# Patient Record
Sex: Female | Born: 1985 | Race: Black or African American | Hispanic: No | Marital: Single | Smoking: Never smoker
Health system: Southern US, Community
[De-identification: ages and names within clinical notes are randomized; demographics above are authoritative.]

## PROBLEM LIST (undated history)

## (undated) ENCOUNTER — Inpatient Hospital Stay (HOSPITAL_COMMUNITY): Payer: Self-pay

## (undated) DIAGNOSIS — F419 Anxiety disorder, unspecified: Secondary | ICD-10-CM

## (undated) DIAGNOSIS — E559 Vitamin D deficiency, unspecified: Secondary | ICD-10-CM

## (undated) DIAGNOSIS — F32A Depression, unspecified: Secondary | ICD-10-CM

## (undated) DIAGNOSIS — Z789 Other specified health status: Secondary | ICD-10-CM

## (undated) DIAGNOSIS — F329 Major depressive disorder, single episode, unspecified: Secondary | ICD-10-CM

## (undated) HISTORY — PX: ABDOMINAL HERNIA REPAIR: SHX539

## (undated) HISTORY — DX: Major depressive disorder, single episode, unspecified: F32.9

## (undated) HISTORY — DX: Depression, unspecified: F32.A

## (undated) HISTORY — DX: Vitamin D deficiency, unspecified: E55.9

## (undated) HISTORY — DX: Other specified health status: Z78.9

## (undated) HISTORY — DX: Anxiety disorder, unspecified: F41.9

---
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
## 2006-08-23 ENCOUNTER — Other Ambulatory Visit: Admission: RE | Admit: 2006-08-23 | Discharge: 2006-08-23 | Payer: Self-pay | Admitting: Emergency Medicine

## 2007-04-27 ENCOUNTER — Other Ambulatory Visit: Admission: RE | Admit: 2007-04-27 | Discharge: 2007-04-27 | Payer: Self-pay | Admitting: Internal Medicine

## 2007-12-11 ENCOUNTER — Other Ambulatory Visit: Admission: RE | Admit: 2007-12-11 | Discharge: 2007-12-11 | Payer: Self-pay | Admitting: Obstetrics and Gynecology

## 2009-12-03 ENCOUNTER — Other Ambulatory Visit: Admission: RE | Admit: 2009-12-03 | Discharge: 2009-12-03 | Payer: Self-pay | Admitting: Obstetrics & Gynecology

## 2009-12-31 ENCOUNTER — Other Ambulatory Visit: Admission: RE | Admit: 2009-12-31 | Discharge: 2009-12-31 | Payer: Self-pay | Admitting: Obstetrics & Gynecology

## 2011-06-07 ENCOUNTER — Other Ambulatory Visit: Payer: Self-pay | Admitting: Nurse Practitioner

## 2011-06-07 ENCOUNTER — Other Ambulatory Visit (HOSPITAL_COMMUNITY)
Admission: RE | Admit: 2011-06-07 | Discharge: 2011-06-07 | Disposition: A | Discharge status: Home / Self Care | Payer: Self-pay | Source: Ambulatory Visit | Attending: Unknown Physician Specialty | Admitting: Unknown Physician Specialty

## 2011-06-07 ENCOUNTER — Other Ambulatory Visit (HOSPITAL_COMMUNITY)
Admission: RE | Admit: 2011-06-07 | Discharge: 2011-06-07 | Disposition: A | Discharge status: Home / Self Care | Payer: Self-pay | Source: Ambulatory Visit | Attending: Family Medicine | Admitting: Family Medicine

## 2011-06-07 DIAGNOSIS — B079 Viral wart, unspecified: Secondary | ICD-10-CM | POA: Insufficient documentation

## 2011-06-07 DIAGNOSIS — R87612 Low grade squamous intraepithelial lesion on cytologic smear of cervix (LGSIL): Secondary | ICD-10-CM | POA: Insufficient documentation

## 2011-06-07 DIAGNOSIS — N870 Mild cervical dysplasia: Secondary | ICD-10-CM | POA: Insufficient documentation

## 2012-01-31 ENCOUNTER — Other Ambulatory Visit (HOSPITAL_COMMUNITY)
Admission: RE | Admit: 2012-01-31 | Discharge: 2012-01-31 | Disposition: A | Discharge status: Home / Self Care | Payer: Self-pay | Source: Ambulatory Visit | Attending: Family Medicine | Admitting: Family Medicine

## 2012-01-31 ENCOUNTER — Other Ambulatory Visit (HOSPITAL_COMMUNITY)
Admission: RE | Admit: 2012-01-31 | Discharge: 2012-01-31 | Disposition: A | Discharge status: Home / Self Care | Payer: Self-pay | Source: Ambulatory Visit | Attending: Unknown Physician Specialty | Admitting: Unknown Physician Specialty

## 2012-01-31 ENCOUNTER — Other Ambulatory Visit: Payer: Self-pay | Admitting: Nurse Practitioner

## 2012-01-31 DIAGNOSIS — R87610 Atypical squamous cells of undetermined significance on cytologic smear of cervix (ASC-US): Secondary | ICD-10-CM | POA: Insufficient documentation

## 2012-01-31 DIAGNOSIS — R87619 Unspecified abnormal cytological findings in specimens from cervix uteri: Secondary | ICD-10-CM | POA: Insufficient documentation

## 2013-01-02 ENCOUNTER — Other Ambulatory Visit: Payer: Self-pay | Admitting: Adult Health

## 2013-01-02 ENCOUNTER — Other Ambulatory Visit (HOSPITAL_COMMUNITY)
Admission: RE | Admit: 2013-01-02 | Discharge: 2013-01-02 | Disposition: A | Discharge status: Home / Self Care | Payer: BC Managed Care – PPO | Source: Ambulatory Visit | Attending: Obstetrics and Gynecology | Admitting: Obstetrics and Gynecology

## 2013-01-02 DIAGNOSIS — Z01419 Encounter for gynecological examination (general) (routine) without abnormal findings: Secondary | ICD-10-CM | POA: Insufficient documentation

## 2013-01-02 DIAGNOSIS — Z113 Encounter for screening for infections with a predominantly sexual mode of transmission: Secondary | ICD-10-CM | POA: Insufficient documentation

## 2013-07-01 ENCOUNTER — Telehealth: Payer: Self-pay | Admitting: *Deleted

## 2013-07-01 NOTE — Telephone Encounter (Signed)
Pt stopped the nuva ring and thinks she would like the patch, cause she won't remember to take the pill. But she weighs about 210 pounds, discussed the nexplanon and IUD with her and she is going to come by to get handouts to read about the different types.

## 2013-07-26 ENCOUNTER — Ambulatory Visit: Payer: Self-pay | Admitting: Adult Health

## 2013-12-10 ENCOUNTER — Encounter (INDEPENDENT_AMBULATORY_CARE_PROVIDER_SITE_OTHER): Payer: Self-pay

## 2013-12-10 ENCOUNTER — Encounter: Payer: Self-pay | Admitting: Adult Health

## 2013-12-10 ENCOUNTER — Ambulatory Visit (INDEPENDENT_AMBULATORY_CARE_PROVIDER_SITE_OTHER): Payer: BC Managed Care – PPO | Admitting: Adult Health

## 2013-12-10 VITALS — BP 102/80 | Ht 63.0 in | Wt 215.0 lb

## 2013-12-10 DIAGNOSIS — Z3201 Encounter for pregnancy test, result positive: Secondary | ICD-10-CM

## 2013-12-10 NOTE — Progress Notes (Signed)
Patient ID: Claire Weber, female   DOB: 09-Aug-1986, 27 y.o.   MRN: [id number] Pt here today for pregnancy test, resulted positive. Pt given prenatal vitamins to take 1 tablet daily and appt for initial new ob in 1- 2 weeks.

## 2013-12-11 ENCOUNTER — Other Ambulatory Visit: Payer: Self-pay | Admitting: Obstetrics & Gynecology

## 2013-12-11 DIAGNOSIS — O3680X0 Pregnancy with inconclusive fetal viability, not applicable or unspecified: Secondary | ICD-10-CM

## 2013-12-13 ENCOUNTER — Ambulatory Visit (INDEPENDENT_AMBULATORY_CARE_PROVIDER_SITE_OTHER): Payer: BC Managed Care – PPO

## 2013-12-13 DIAGNOSIS — O3680X0 Pregnancy with inconclusive fetal viability, not applicable or unspecified: Secondary | ICD-10-CM

## 2013-12-13 NOTE — Progress Notes (Signed)
U/S(8+3wks)-single IUP with +FCA noted, FHR-175 bpm, cx long and closed, bilateral adnexa wnl, CRL c/w LMP dates

## 2013-12-25 ENCOUNTER — Encounter: Payer: Self-pay | Admitting: Advanced Practice Midwife

## 2013-12-25 ENCOUNTER — Encounter: Payer: BC Managed Care – PPO | Admitting: Advanced Practice Midwife

## 2013-12-25 ENCOUNTER — Ambulatory Visit (INDEPENDENT_AMBULATORY_CARE_PROVIDER_SITE_OTHER): Payer: BC Managed Care – PPO | Admitting: Advanced Practice Midwife

## 2013-12-25 VITALS — BP 118/76 | Wt 215.0 lb

## 2013-12-25 DIAGNOSIS — Z331 Pregnant state, incidental: Secondary | ICD-10-CM

## 2013-12-25 DIAGNOSIS — Z3490 Encounter for supervision of normal pregnancy, unspecified, unspecified trimester: Secondary | ICD-10-CM | POA: Insufficient documentation

## 2013-12-25 DIAGNOSIS — Z3400 Encounter for supervision of normal first pregnancy, unspecified trimester: Secondary | ICD-10-CM

## 2013-12-25 DIAGNOSIS — Z1389 Encounter for screening for other disorder: Secondary | ICD-10-CM

## 2013-12-25 LAB — POCT URINALYSIS DIPSTICK
Blood, UA: NEGATIVE
Protein, UA: NEGATIVE

## 2013-12-25 MED ORDER — METRONIDAZOLE 0.75 % VA GEL: 1.0000 | Freq: Two times a day (BID) | VAGINAL | Status: DC

## 2013-12-25 MED ORDER — METRONIDAZOLE 0.75 % VA GEL: 1.0000 | Freq: Every day | VAGINAL | Status: DC

## 2013-12-25 NOTE — Progress Notes (Signed)
  Subjective:    Claire Weber is a G1P0 [redacted]w[redacted]d being seen today for her first obstetrical visit.  Her obstetrical history is significant for nothing.  She is undecided about whether she is going to continue with the pregnancy.  She is in a fairly stable relationship, b ut concerned about finances.  Informed that sooner is safer than later, and encouraged to make a decision by 12 weeks.  .  Pregnancy history fully reviewed.  Patient reports no complaints.  Filed Vitals:   12/25/13 1050  BP: 118/76  Weight: 215 lb (97.523 kg)    HISTORY: OB History  Gravida Para Term Preterm AB SAB TAB Ectopic Multiple Living  1             # Outcome Date GA Lbr Len/2nd Weight Sex Delivery Anes PTL Lv  1 CUR              Past Medical History  Diagnosis Date  . Medical history non-contributory    Past Surgical History  Procedure Laterality Date  . Abdominal hernia repair      at age 27   Family History  Problem Relation Age of Onset  . Hypertension Mother   . Hyperlipidemia Mother   . Hypertension Father   . Diabetes Father      Exam       Pelvic Exam:    Perineum: Normal Perineum   Vulva: normal   Vagina:  normal mucosa, thin white discharge with an amine odor, no palpable nodules   Uterus    gravid     Cervix: normal   Adnexa: Not palpable   Urinary:  urethral meatus normal    System:     Skin: normal coloration and turgor, no rashes    Neurologic: oriented, normal, normal mood   Extremities: normal strength, tone, and muscle mass   HEENT PERRLA   Mouth/Teeth mucous membranes moist, pharynx normal without lesions   Neck supple and no masses   Cardiovascular: regular rate and rhythm   Respiratory:  appears well, vitals normal, no respiratory distress, acyanotic, normal RR   Abdomen: soft, non-tender; bowel sounds normal; no masses,  no organomegaly          Assessment:    Pregnancy: G1P0 Patient Active Problem List   Diagnosis Date Noted  . Pregnant 12/25/2013   BV       Plan:     Initial labs deferred until decision to continue pregnancy is made Metrogel qhs X5 Problem list reviewed and updated. Genetic Screening discussed Integrated Screen: requested.If she continues pregnancy  Ultrasound discussed; fetal survey: requested.  Follow up in 2 weeks.  Weber,Claire Beirne 12/25/2013

## 2014-01-27 ENCOUNTER — Encounter (INDEPENDENT_AMBULATORY_CARE_PROVIDER_SITE_OTHER): Payer: Self-pay

## 2014-01-27 ENCOUNTER — Encounter: Payer: PRIVATE HEALTH INSURANCE | Admitting: Obstetrics & Gynecology

## 2014-01-29 ENCOUNTER — Encounter: Payer: Self-pay | Admitting: Obstetrics & Gynecology

## 2014-01-29 NOTE — Progress Notes (Signed)
This encounter was created in error - please disregard.

## 2014-07-09 ENCOUNTER — Encounter: Payer: Self-pay | Admitting: Adult Health

## 2014-07-09 ENCOUNTER — Ambulatory Visit (INDEPENDENT_AMBULATORY_CARE_PROVIDER_SITE_OTHER): Payer: PRIVATE HEALTH INSURANCE | Admitting: Adult Health

## 2014-07-09 DIAGNOSIS — Z3201 Encounter for pregnancy test, result positive: Secondary | ICD-10-CM

## 2014-07-09 LAB — POCT URINE PREGNANCY: Preg Test, Ur: POSITIVE

## 2014-07-09 NOTE — Progress Notes (Signed)
Pt here for pregnancy test. Positive result. Pt has had some cramping, no spotting. Advised to increase fluids, and take it easy when she notices cramping. Advised can be normal in early pregnancy, that's just everything getting settled into place. Pt voiced understanding. JSY

## 2014-07-12 ENCOUNTER — Inpatient Hospital Stay (HOSPITAL_COMMUNITY)
Admission: AD | Admit: 2014-07-12 | Discharge: 2014-07-12 | Disposition: A | Discharge status: Home / Self Care | Payer: Managed Care, Other (non HMO) | Source: Ambulatory Visit | Attending: Obstetrics & Gynecology | Admitting: Obstetrics & Gynecology

## 2014-07-12 ENCOUNTER — Inpatient Hospital Stay (HOSPITAL_COMMUNITY): Payer: Managed Care, Other (non HMO)

## 2014-07-12 ENCOUNTER — Encounter (HOSPITAL_COMMUNITY): Payer: Self-pay

## 2014-07-12 DIAGNOSIS — R109 Unspecified abdominal pain: Secondary | ICD-10-CM

## 2014-07-12 DIAGNOSIS — O209 Hemorrhage in early pregnancy, unspecified: Secondary | ICD-10-CM | POA: Diagnosis present

## 2014-07-12 DIAGNOSIS — A499 Bacterial infection, unspecified: Secondary | ICD-10-CM | POA: Diagnosis not present

## 2014-07-12 DIAGNOSIS — N760 Acute vaginitis: Secondary | ICD-10-CM | POA: Diagnosis not present

## 2014-07-12 DIAGNOSIS — O2390 Unspecified genitourinary tract infection in pregnancy, unspecified trimester: Secondary | ICD-10-CM | POA: Diagnosis not present

## 2014-07-12 DIAGNOSIS — B9689 Other specified bacterial agents as the cause of diseases classified elsewhere: Secondary | ICD-10-CM | POA: Insufficient documentation

## 2014-07-12 DIAGNOSIS — O26899 Other specified pregnancy related conditions, unspecified trimester: Secondary | ICD-10-CM

## 2014-07-12 LAB — URINALYSIS, ROUTINE W REFLEX MICROSCOPIC
Bilirubin Urine: NEGATIVE
GLUCOSE, UA: NEGATIVE mg/dL
Ketones, ur: NEGATIVE mg/dL
LEUKOCYTES UA: NEGATIVE
Nitrite: NEGATIVE
PH: 7.5 (ref 5.0–8.0)
Protein, ur: NEGATIVE mg/dL
SPECIFIC GRAVITY, URINE: 1.015 (ref 1.005–1.030)
Urobilinogen, UA: 0.2 mg/dL (ref 0.0–1.0)

## 2014-07-12 LAB — CBC
HEMATOCRIT: 40.1 % (ref 36.0–46.0)
Hemoglobin: 14 g/dL (ref 12.0–15.0)
MCH: 32.1 pg (ref 26.0–34.0)
MCHC: 34.9 g/dL (ref 30.0–36.0)
MCV: 92 fL (ref 78.0–100.0)
Platelets: 219 10*3/uL (ref 150–400)
RBC: 4.36 MIL/uL (ref 3.87–5.11)
RDW: 13.3 % (ref 11.5–15.5)
WBC: 6.7 10*3/uL (ref 4.0–10.5)

## 2014-07-12 LAB — URINE MICROSCOPIC-ADD ON

## 2014-07-12 LAB — WET PREP, GENITAL
TRICH WET PREP: NONE SEEN
Yeast Wet Prep HPF POC: NONE SEEN

## 2014-07-12 LAB — ABO/RH: ABO/RH(D): O POS

## 2014-07-12 LAB — HCG, QUANTITATIVE, PREGNANCY: hCG, Beta Chain, Quant, S: 9369 m[IU]/mL — ABNORMAL HIGH (ref <5)

## 2014-07-12 MED ORDER — ACETAMINOPHEN 325 MG PO TABS: 650.0000 mg | ORAL_TABLET | Freq: Once | ORAL | Status: DC

## 2014-07-12 MED ORDER — METRONIDAZOLE 0.75 % VA GEL: 1.0000 | Freq: Two times a day (BID) | VAGINAL | Status: DC

## 2014-07-12 MED ORDER — PROMETHAZINE HCL 25 MG PO TABS: 25.0000 mg | ORAL_TABLET | Freq: Four times a day (QID) | ORAL | Status: DC | PRN

## 2014-07-12 NOTE — MAU Note (Signed)
Pt presents complaining of vaginal bleeding that started this am when she wipes. Complains of thick white vaginal discharge.

## 2014-07-12 NOTE — MAU Provider Note (Signed)
History     CSN: [id number]  Arrival date and time: 07/12/14 1001   First Provider Initiated Contact with Patient 07/12/14 1037      Chief Complaint  Patient presents with  . Vaginal Bleeding   HPI Comments: Claire Weber 28 y.o. G2P0010 [id number] present to MAU with cramping x 1 week and vaginal spotting x 1 day. Her pain is 7/10 and she has not taken any medications.  Last intercourse was 1 week ago. She had a TAB in Dec 2014. [facility]  Vaginal Bleeding Associated symptoms include abdominal pain.      Past Medical History  Diagnosis Date  . Medical history non-contributory     Past Surgical History  Procedure Laterality Date  . Abdominal hernia repair      at age 89    Family History  Problem Relation Age of Onset  . Hypertension Mother   . Hyperlipidemia Mother   . Hypertension Father   . Diabetes Father     History  Substance Use Topics  . Smoking status: Never Smoker   . Smokeless tobacco: Never Used  . Alcohol Use: No     Comment: wine on occ-not now    Allergies: No Known Allergies  Prescriptions prior to admission  Medication Sig Dispense Refill  . acetaminophen (TYLENOL) 500 MG tablet Take 500 mg by mouth every 4 (four) hours as needed for mild pain or moderate pain.      Marland Kitchen. ibuprofen (ADVIL,MOTRIN) 200 MG tablet Take 400 mg by mouth every 4 (four) hours as needed for mild pain or moderate pain.      . Pediatric Multiple Vit-C-FA (FLINSTONES GUMMIES OMEGA-3 DHA PO) Take 2 tablets by mouth daily.         Review of Systems  Constitutional: Negative.   HENT: Negative.   Eyes: Negative.   Respiratory: Negative.   Cardiovascular: Negative.   Gastrointestinal: Positive for abdominal pain.  Genitourinary: Positive for vaginal bleeding.       Vaginal spotting  Musculoskeletal: Negative.   Skin: Negative.   Neurological: Negative.   Psychiatric/Behavioral: Negative.    Physical Exam   Blood pressure 118/63, pulse 88,  temperature 98 F (36.7 C), temperature source Oral, resp. rate 18, last menstrual period 05/27/2014, unknown if currently breastfeeding.  Physical Exam  Constitutional: She is oriented to person, place, and time. She appears well-developed and well-nourished. No distress.  HENT:  Head: Normocephalic and atraumatic.  Eyes: Pupils are equal, round, and reactive to light.  GI: Soft. She exhibits no distension. There is no tenderness. There is no rebound and no guarding.  Genitourinary:  Genital:External negative Vaginal:small amount pick discharge Cervix:closed/ thick Bimanual:pressure in LLQ only otherwise negative   Musculoskeletal: Normal range of motion.  Neurological: She is alert and oriented to person, place, and time.  Skin: Skin is warm and dry.  Psychiatric: She has a normal mood and affect. Her behavior is normal. Judgment and thought content normal.   Results for orders placed during the hospital encounter of 07/12/14 (from the past 24 hour(s))  URINALYSIS, ROUTINE W REFLEX MICROSCOPIC     Status: Abnormal   Collection Time    07/12/14 10:25 AM      Result Value Ref Range   Color, Urine YELLOW  YELLOW   APPearance CLEAR  CLEAR   Specific Gravity, Urine 1.015  1.005 - 1.030   pH 7.5  5.0 - 8.0   Glucose, UA NEGATIVE  NEGATIVE mg/dL  Hgb urine dipstick TRACE (*) NEGATIVE   Bilirubin Urine NEGATIVE  NEGATIVE   Ketones, ur NEGATIVE  NEGATIVE mg/dL   Protein, ur NEGATIVE  NEGATIVE mg/dL   Urobilinogen, UA 0.2  0.0 - 1.0 mg/dL   Nitrite NEGATIVE  NEGATIVE   Leukocytes, UA NEGATIVE  NEGATIVE  URINE MICROSCOPIC-ADD ON     Status: Abnormal   Collection Time    07/12/14 10:25 AM      Result Value Ref Range   Squamous Epithelial / LPF FEW (*) RARE   WBC, UA 0-2  <3 WBC/hpf   RBC / HPF 0-2  <3 RBC/hpf   Bacteria, UA FEW (*) RARE   Urine-Other MUCOUS PRESENT    WET PREP, GENITAL     Status: Abnormal   Collection Time    07/12/14 10:45 AM      Result Value Ref Range    Yeast Wet Prep HPF POC NONE SEEN  NONE SEEN   Trich, Wet Prep NONE SEEN  NONE SEEN   Clue Cells Wet Prep HPF POC MODERATE (*) NONE SEEN   WBC, Wet Prep HPF POC FEW (*) NONE SEEN  CBC     Status: None   Collection Time    07/12/14 11:05 AM      Result Value Ref Range   WBC 6.7  4.0 - 10.5 K/uL   RBC 4.36  3.87 - 5.11 MIL/uL   Hemoglobin 14.0  12.0 - 15.0 g/dL   HCT 96.0  45.4 - 09.8 %   MCV 92.0  78.0 - 100.0 fL   MCH 32.1  26.0 - 34.0 pg   MCHC 34.9  30.0 - 36.0 g/dL   RDW 11.9  14.7 - 82.9 %   Platelets 219  150 - 400 K/uL  ABO/RH     Status: None   Collection Time    07/12/14 11:05 AM      Result Value Ref Range   ABO/RH(D) O POS    HCG, QUANTITATIVE, PREGNANCY     Status: Abnormal   Collection Time    07/12/14 11:05 AM      Result Value Ref Range   hCG, Beta Chain, Quant, S 9369 (*) <5 mIU/mL   US Ob Comp Add'l Gest Less 14 Wks  07/12/2014   CLINICAL DATA:  Spotting  EXAM: OBSTETRIC <14 WK ULTRASOUND  TECHNIQUE: Transabdominal ultrasound was performed for evaluation of the gestation as well as the maternal uterus and adnexal regions.  COMPARISON:  None.  FINDINGS: Intrauterine gestational sac: Single intrauterine gestational sac identified.  Yolk sac:  Visualized  Embryo:  Visualized  Cardiac Activity: Yes  Heart Rate: 96 bpm  MSD: 10  mm   5 w   5  d  CRL:   1.4  mm                    [place] EDC: March 09, 2015  Maternal uterus/adnexae: No subchorionic hemorrhage. Unremarkable sonographic appearance of the bilateral ovaries. Trace free fluid.  IMPRESSION: Single viable intrauterine gestation. By mean sac diameter, the estimated gestational age is 5 weeks 5 days and the estimated date of confinement March 09, 2015.  The fetal heart rate is 96 beats per min.   Electronically Signed   By: Malachy Moan M.D.   On: 07/12/2014 12:13   US Ob Transvaginal  07/12/2014   CLINICAL DATA:  Spotting  EXAM: OBSTETRIC <14 WK ULTRASOUND  TECHNIQUE: Transabdominal ultrasound was performed for  evaluation of the gestation  as well as the maternal uterus and adnexal regions.  COMPARISON:  None.  FINDINGS: Intrauterine gestational sac: Single intrauterine gestational sac identified.  Yolk sac:  Visualized  Embryo:  Visualized  Cardiac Activity: Yes  Heart Rate: 96 bpm  MSD: 10  mm   5 w   5  d  CRL:   1.4  mm                    [place] EDC: March 09, 2015  Maternal uterus/adnexae: No subchorionic hemorrhage. Unremarkable sonographic appearance of the bilateral ovaries. Trace free fluid.  IMPRESSION: Single viable intrauterine gestation. By mean sac diameter, the estimated gestational age is 5 weeks 5 days and the estimated date of confinement March 09, 2015.  The fetal heart rate is 96 beats per min.   Electronically Signed   By: Malachy Moan M.D.   On: 07/12/2014 12:13    MAU Course  Procedures  MDM Wet prep, GC, Chlamydia, CBC, UA, U/S, ABORh, Quant Tylenol for discomforts Reviewed results with patient  Assessment and Plan   A: Abdominal pain in early pregnancy Bacterial vaginosis Vaginal Bleeding in early Pregnancy  P: Above orders Pt requests Metrogel  Advised no intercourse x 1 week Tylenol for discomforts Follow up with [facility]  Carolynn Serve 07/12/2014, 10:57 AM

## 2014-07-12 NOTE — MAU Provider Note (Signed)
Attestation of Attending Supervision of Advanced Practitioner (CNM/NP): Evaluation and management procedures were performed by the Advanced Practitioner under my supervision and collaboration.  I have reviewed the Advanced Practitioner's note and chart, and I agree with the management and plan.  HARRAWAY-SMITH, Saamiya Jeppsen 1:01 PM     

## 2014-07-12 NOTE — Discharge Instructions (Signed)
Bacterial Vaginosis Bacterial vaginosis is a vaginal infection that occurs when the normal balance of bacteria in the vagina is disrupted. It results from an overgrowth of certain bacteria. This is the most common vaginal infection in women of childbearing age. Treatment is important to prevent complications, especially in pregnant women, as it can cause a premature delivery. CAUSES  Bacterial vaginosis is caused by an increase in harmful bacteria that are normally present in smaller amounts in the vagina. Several different kinds of bacteria can cause bacterial vaginosis. However, the reason that the condition develops is not fully understood. RISK FACTORS Certain activities or behaviors can put you at an increased risk of developing bacterial vaginosis, including:  Having a new sex partner or multiple sex partners.  Douching.  Using an intrauterine device (IUD) for contraception. Women do not get bacterial vaginosis from toilet seats, bedding, swimming pools, or contact with objects around them. SIGNS AND SYMPTOMS  Some women with bacterial vaginosis have no signs or symptoms. Common symptoms include:  Grey vaginal discharge.  A fishlike odor with discharge, especially after sexual intercourse.  Itching or burning of the vagina and vulva.  Burning or pain with urination. DIAGNOSIS  Your health care provider will take a medical history and examine the vagina for signs of bacterial vaginosis. A sample of vaginal fluid may be taken. Your health care provider will look at this sample under a microscope to check for bacteria and abnormal cells. A vaginal pH test may also be done.  TREATMENT  Bacterial vaginosis may be treated with antibiotic medicines. These may be given in the form of a pill or a vaginal cream. A second round of antibiotics may be prescribed if the condition comes back after treatment.  HOME CARE INSTRUCTIONS   Only take over-the-counter or prescription medicines as  directed by your health care provider.  If antibiotic medicine was prescribed, take it as directed. Make sure you finish it even if you start to feel better.  Do not have sex until treatment is completed.  Tell all sexual partners that you have a vaginal infection. They should see their health care provider and be treated if they have problems, such as a mild rash or itching.  Practice safe sex by using condoms and only having one sex partner. SEEK MEDICAL CARE IF:   Your symptoms are not improving after 3 days of treatment.  You have increased discharge or pain.  You have a fever. MAKE SURE YOU:   Understand these instructions.  Will watch your condition.  Will get help right away if you are not doing well or get worse. FOR MORE INFORMATION  Centers for Disease Control and Prevention, Division of STD Prevention: SolutionApps.co.zawww.cdc.gov/std American Sexual Health Association (ASHA): www.ashastd.org  Document Released: 12/12/2005 Document Revised: 10/02/2013 Document Reviewed: 07/24/2013 [facility] Patient Information 2015 [place]. This information is not intended to replace advice given to you by your health care provider. Make sure you discuss any questions you have with your health care provider. Abdominal Pain During Pregnancy Belly (abdominal) pain is common during pregnancy. Most of the time, it is not a serious problem. Other times, it can be a sign that something is wrong with the pregnancy. Always tell your doctor if you have belly pain. HOME CARE Monitor your belly pain for any changes. The following actions may help you feel better:  Do not have sex (intercourse) or put anything in your vagina until you feel better.  Rest until your pain stops.  Drink  clear fluids if you feel sick to your stomach (nauseous). Do not eat solid food until you feel better.  Only take medicine as told by your doctor.  Keep all doctor visits as told. GET HELP RIGHT AWAY IF:   You are  bleeding, leaking fluid, or pieces of tissue come out of your vagina.  You have more pain or cramping.  You keep throwing up (vomiting).  You have pain when you pee (urinate) or have blood in your pee.  You have a fever.  You do not feel your baby moving as much.  You feel very weak or feel like passing out.  You have trouble breathing, with or without belly pain.  You have a very bad headache and belly pain.  You have fluid leaking from your vagina and belly pain.  You keep having watery poop (diarrhea).  Your belly pain does not go away after resting, or the pain gets worse. MAKE SURE YOU:   Understand these instructions.  Will watch your condition.  Will get help right away if you are not doing well or get worse. Document Released: 11/30/2009 Document Revised: 08/14/2013 Document Reviewed: 07/11/2013 [facility] Patient Information 2015 [place], [place]. This information is not intended to replace advice given to you by your health care provider. Make sure you discuss any questions you have with your health care provider. Vaginal Bleeding During Pregnancy, First Trimester A small amount of bleeding (spotting) from the vagina is relatively common in early pregnancy. It usually stops on its own. Various things may cause bleeding or spotting in early pregnancy. Some bleeding may be related to the pregnancy, and some may not. In most cases, the bleeding is normal and is not a problem. However, bleeding can also be a sign of something serious. Be sure to tell your health care provider about any vaginal bleeding right away. Some possible causes of vaginal bleeding during the first trimester include:  Infection or inflammation of the cervix.  Growths (polyps) on the cervix.  Miscarriage or threatened miscarriage.  Pregnancy tissue has developed outside of the uterus and in a fallopian tube (tubal pregnancy).  Tiny cysts have developed in the uterus instead of pregnancy tissue  (molar pregnancy). HOME CARE INSTRUCTIONS  Watch your condition for any changes. The following actions may help to lessen any discomfort you are feeling:  Follow your health care provider's instructions for limiting your activity. If your health care provider orders bed rest, you may need to stay in bed and only get up to use the bathroom. However, your health care provider may allow you to continue light activity.  If needed, make plans for someone to help with your regular activities and responsibilities while you are on bed rest.  Keep track of the number of pads you use each day, how often you change pads, and how soaked (saturated) they are. Write this down.  Do not use tampons. Do not douche.  Do not have sexual intercourse or orgasms until approved by your health care provider.  If you pass any tissue from your vagina, save the tissue so you can show it to your health care provider.  Only take over-the-counter or prescription medicines as directed by your health care provider.  Do not take aspirin because it can make you bleed.  Keep all follow-up appointments as directed by your health care provider. SEEK MEDICAL CARE IF:  You have any vaginal bleeding during any part of your pregnancy.  You have cramps or labor pains.  You have a fever, not controlled by medicine. SEEK IMMEDIATE MEDICAL CARE IF:   You have severe cramps in your back or belly (abdomen).  You pass large clots or tissue from your vagina.  Your bleeding increases.  You feel light-headed or weak, or you have fainting episodes.  You have chills.  You are leaking fluid or have a gush of fluid from your vagina.  You pass out while having a bowel movement. MAKE SURE YOU:  Understand these instructions.  Will watch your condition.  Will get help right away if you are not doing well or get worse. Document Released: 09/21/2005 Document Revised: 12/17/2013 Document Reviewed: 08/19/2013 [facility] Patient  Information 2015 [place], [place]. This information is not intended to replace advice given to you by your health care provider. Make sure you discuss any questions you have with your health care provider.

## 2014-07-13 LAB — CULTURE, OB URINE
Colony Count: 10000
Special Requests: NORMAL

## 2014-07-14 ENCOUNTER — Other Ambulatory Visit: Payer: Self-pay | Admitting: Obstetrics and Gynecology

## 2014-07-14 DIAGNOSIS — O3680X1 Pregnancy with inconclusive fetal viability, fetus 1: Secondary | ICD-10-CM

## 2014-07-14 LAB — GC/CHLAMYDIA PROBE AMP
CT Probe RNA: NEGATIVE
GC Probe RNA: NEGATIVE

## 2014-07-16 ENCOUNTER — Ambulatory Visit (INDEPENDENT_AMBULATORY_CARE_PROVIDER_SITE_OTHER): Payer: PRIVATE HEALTH INSURANCE | Admitting: Women's Health

## 2014-07-16 ENCOUNTER — Other Ambulatory Visit: Payer: Self-pay | Admitting: Obstetrics and Gynecology

## 2014-07-16 ENCOUNTER — Encounter: Payer: Self-pay | Admitting: Women's Health

## 2014-07-16 ENCOUNTER — Ambulatory Visit (INDEPENDENT_AMBULATORY_CARE_PROVIDER_SITE_OTHER): Payer: PRIVATE HEALTH INSURANCE

## 2014-07-16 VITALS — BP 104/68 | Wt 214.0 lb

## 2014-07-16 DIAGNOSIS — O468X1 Other antepartum hemorrhage, first trimester: Secondary | ICD-10-CM

## 2014-07-16 DIAGNOSIS — O200 Threatened abortion: Secondary | ICD-10-CM

## 2014-07-16 DIAGNOSIS — Z1389 Encounter for screening for other disorder: Secondary | ICD-10-CM

## 2014-07-16 DIAGNOSIS — O3680X1 Pregnancy with inconclusive fetal viability, fetus 1: Secondary | ICD-10-CM

## 2014-07-16 DIAGNOSIS — O468X9 Other antepartum hemorrhage, unspecified trimester: Secondary | ICD-10-CM | POA: Insufficient documentation

## 2014-07-16 DIAGNOSIS — Z3481 Encounter for supervision of other normal pregnancy, first trimester: Secondary | ICD-10-CM

## 2014-07-16 DIAGNOSIS — O418X90 Other specified disorders of amniotic fluid and membranes, unspecified trimester, not applicable or unspecified: Secondary | ICD-10-CM | POA: Insufficient documentation

## 2014-07-16 DIAGNOSIS — Z3480 Encounter for supervision of other normal pregnancy, unspecified trimester: Secondary | ICD-10-CM

## 2014-07-16 DIAGNOSIS — Z36 Encounter for antenatal screening of mother: Secondary | ICD-10-CM

## 2014-07-16 DIAGNOSIS — Z331 Pregnant state, incidental: Secondary | ICD-10-CM

## 2014-07-16 DIAGNOSIS — O418X10 Other specified disorders of amniotic fluid and membranes, first trimester, not applicable or unspecified: Secondary | ICD-10-CM

## 2014-07-16 LAB — POCT URINALYSIS DIPSTICK
GLUCOSE UA: NEGATIVE
Ketones, UA: NEGATIVE
LEUKOCYTES UA: NEGATIVE
NITRITE UA: NEGATIVE

## 2014-07-16 NOTE — Patient Instructions (Addendum)
Nausea & Vomiting  Have saltine crackers or pretzels by your bed and eat a few bites before you raise your head out of bed in the morning  Eat small frequent meals throughout the day instead of large meals  Drink plenty of fluids throughout the day to stay hydrated, just don't drink a lot of fluids with your meals.  This can make your stomach fill up faster making you feel sick  Do not brush your teeth right after you eat  Products with real ginger are good for nausea, like ginger ale and ginger hard candy Make sure it says made with real ginger!  Sucking on sour candy like lemon heads is also good for nausea  If your prenatal vitamins make you nauseated, take them at night so you will sleep through the nausea  If you feel like you need medicine for the nausea & vomiting please let us know  If you are unable to keep any fluids or food down please let us know    Pregnancy - First Trimester During sexual intercourse, millions of sperm go into the vagina. Only 1 sperm will penetrate and fertilize the female egg while it is in the Fallopian tube. One week later, the fertilized egg implants into the wall of the uterus. An embryo begins to develop into a baby. At 6 to 8 weeks, the eyes and face are formed and the heartbeat can be seen on ultrasound. At the end of 12 weeks (first trimester), all the baby's organs are formed. Now that you are pregnant, you will want to do everything you can to have a healthy baby. Two of the most important things are to get good prenatal care and follow your caregiver's instructions. Prenatal care is all the medical care you receive before the baby's birth. It is given to prevent, find, and treat problems during the pregnancy and childbirth. PRENATAL EXAMS  During prenatal visits, your weight, blood pressure, and urine are checked. This is done to make sure you are healthy and progressing normally during the pregnancy.  A pregnant woman should gain 25 to 35 pounds  during the pregnancy. However, if you are overweight or underweight, your caregiver will advise you regarding your weight.  Your caregiver will ask and answer questions for you.  Blood work, cervical cultures, other necessary tests, and a Pap test are done during your prenatal exams. These tests are done to check on your health and the probable health of your baby. Tests are strongly recommended and done for HIV with your permission. This is the virus that causes AIDS. These tests are done because medicines can be given to help prevent your baby from being born with this infection should you have been infected without knowing it. Blood work is also used to find out your blood type, previous infections, and follow your blood levels (hemoglobin).  Low hemoglobin (anemia) is common during pregnancy. Iron and vitamins are given to help prevent this. Later in the pregnancy, blood tests for diabetes will be done along with any other tests if any problems develop.  You may need other tests to make sure you and the baby are doing well. CHANGES DURING THE FIRST TRIMESTER  Your body goes through many changes during pregnancy. They vary from person to person. Talk to your caregiver about changes you notice and are concerned about. Changes can include:  Your menstrual period stops.  The egg and sperm carry the genes that determine what you look like. Genes from you   and your partner are forming a baby. The female genes determine whether the baby is a boy or a girl.  Your body increases in girth and you may feel bloated.  Feeling sick to your stomach (nauseous) and throwing up (vomiting). If the vomiting is uncontrollable, call your caregiver.  Your breasts will begin to enlarge and become tender.  Your nipples may stick out more and become darker.  The need to urinate more. Painful urination may mean you have a bladder infection.  Tiring easily.  Loss of appetite.  Cravings for certain kinds of  food.  At first, you may gain or lose a couple of pounds.  You may have changes in your emotions from day to day (excited to be pregnant or concerned something may go wrong with the pregnancy and baby).  You may have more vivid and strange dreams. HOME CARE INSTRUCTIONS   It is very important to avoid all smoking, alcohol and non-prescribed drugs during your pregnancy. These affect the formation and growth of the baby. Avoid chemicals while pregnant to ensure the delivery of a healthy infant.  Start your prenatal visits by the 12th week of pregnancy. They are usually scheduled monthly at first, then more often in the last 2 months before delivery. Keep your caregiver's appointments. Follow your caregiver's instructions regarding medicine use, blood and lab tests, exercise, and diet.  During pregnancy, you are providing food for you and your baby. Eat regular, well-balanced meals. Choose foods such as meat, fish, milk and other low fat dairy products, vegetables, fruits, and whole-grain breads and cereals. Your caregiver will tell you of the ideal weight gain.  You can help morning sickness by keeping soda crackers at the bedside. Eat a couple before arising in the morning. You may want to use the crackers without salt on them.  Eating 4 to 5 small meals rather than 3 large meals a day also may help the nausea and vomiting.  Drinking liquids between meals instead of during meals also seems to help nausea and vomiting.  A physical sexual relationship may be continued throughout pregnancy if there are no other problems. Problems may be early (premature) leaking of amniotic fluid from the membranes, vaginal bleeding, or belly (abdominal) pain.  Exercise regularly if there are no restrictions. Check with your caregiver or physical therapist if you are unsure of the safety of some of your exercises. Greater weight gain will occur in the last 2 trimesters of pregnancy. Exercising will  help:  Control your weight.  Keep you in shape.  Prepare you for labor and delivery.  Help you lose your pregnancy weight after you deliver your baby.  Wear a good support or jogging bra for breast tenderness during pregnancy. This may help if worn during sleep too.  Ask when prenatal classes are available. Begin classes when they are offered.  Do not use hot tubs, steam rooms, or saunas.  Wear your seat belt when driving. This protects you and your baby if you are in an accident.  Avoid raw meat, uncooked cheese, cat litter boxes, and soil used by cats throughout the pregnancy. These carry germs that can cause birth defects in the baby.  The first trimester is a good time to visit your dentist for your dental health. Getting your teeth cleaned is okay. Use a softer toothbrush and brush gently during pregnancy.  Ask for help if you have financial, counseling, or nutritional needs during pregnancy. Your caregiver will be able to offer counseling for   these needs as well as refer you for other special needs.  Do not take any medicines or herbs unless told by your caregiver.  Inform your caregiver if there is any mental or physical domestic violence.  Make a list of emergency phone numbers of family, friends, hospital, and police and fire departments.  Write down your questions. Take them to your prenatal visit.  Do not douche.  Do not cross your legs.  If you have to stand for long periods of time, rotate you feet or take small steps in a circle.  You may have more vaginal secretions that may require a sanitary pad. Do not use tampons or scented sanitary pads. MEDICINES AND DRUG USE IN PREGNANCY  Take prenatal vitamins as directed. The vitamin should contain 1 milligram of folic acid. Keep all vitamins out of reach of children. Only a couple vitamins or tablets containing iron may be fatal to a baby or young child when ingested.  Avoid use of all medicines, including herbs,  over-the-counter medicines, not prescribed or suggested by your caregiver. Only take over-the-counter or prescription medicines for pain, discomfort, or fever as directed by your caregiver. Do not use aspirin, ibuprofen, or naproxen unless directed by your caregiver.  Let your caregiver also know about herbs you may be using.  Alcohol is related to a number of birth defects. This includes fetal alcohol syndrome. All alcohol, in any form, should be avoided completely. Smoking will cause low birth rate and premature babies.  Street or illegal drugs are very harmful to the baby. They are absolutely forbidden. A baby born to an addicted mother will be addicted at birth. The baby will go through the same withdrawal an adult does.  Let your caregiver know about any medicines that you have to take and for what reason you take them. SEEK MEDICAL CARE IF:  You have any concerns or worries during your pregnancy. It is better to call with your questions if you feel they cannot wait, rather than worry about them. SEEK IMMEDIATE MEDICAL CARE IF:   An unexplained oral temperature above 102 F (38.9 C) develops, or as your caregiver suggests.  You have leaking of fluid from the vagina (birth canal). If leaking membranes are suspected, take your temperature and inform your caregiver of this when you call.  There is vaginal spotting or bleeding. Notify your caregiver of the amount and how many pads are used.  You develop a bad smelling vaginal discharge with a change in the color.  You continue to feel sick to your stomach (nauseated) and have no relief from remedies suggested. You vomit blood or coffee ground-like materials.  You lose more than 2 pounds of weight in 1 week.  You gain more than 2 pounds of weight in 1 week and you notice swelling of your face, hands, feet, or legs.  You gain 5 pounds or more in 1 week (even if you do not have swelling of your hands, face, legs, or feet).  You get  exposed to German measles and have never had them.  You are exposed to fifth disease or chickenpox.  You develop belly (abdominal) pain. Round ligament discomfort is a common non-cancerous (benign) cause of abdominal pain in pregnancy. Your caregiver still must evaluate this.  You develop headache, fever, diarrhea, pain with urination, or shortness of breath.  You fall or are in a car accident or have any kind of trauma.  There is mental or physical violence in your home. Document   Released: 12/06/2001 Document Revised: 09/05/2012 Document Reviewed: 10/22/2013 [facility] Patient Information 2015 [employer], [place]. This information is not intended to replace advice given to you by your health care provider. Make sure you discuss any questions you have with your health care provider.  Subchorionic Hematoma A subchorionic hematoma is a gathering of blood between the outer wall of the placenta and the inner wall of the womb (uterus). The placenta is the organ that connects the fetus to the wall of the uterus. The placenta performs the feeding, breathing (oxygen to the fetus), and waste removal (excretory work) of the fetus.  Subchorionic hematoma is the most common abnormality found on a result from ultrasonography done during the first trimester or early second trimester of pregnancy. If there has been little or no vaginal bleeding, early small hematomas usually shrink on their own and do not affect your baby or pregnancy. The blood is gradually absorbed over 1-2 weeks. When bleeding starts later in pregnancy or the hematoma is larger or occurs in an older pregnant woman, the outcome may not be as good. Larger hematomas may get bigger, which increases the chances for miscarriage. Subchorionic hematoma also increases the risk of premature detachment of the placenta from the uterus, preterm (premature) labor, and stillbirth. HOME CARE INSTRUCTIONS   Stay on bed rest if your health care provider recommends  this. Although bed rest will not prevent more bleeding or prevent a miscarriage, your health care provider may recommend bed rest until you are advised otherwise.  Avoid heavy lifting (more than 10 lb [4.5 kg]), exercise, sexual intercourse, or douching as directed by your health care provider.  Keep track of the number of pads you use each day and how soaked (saturated) they are. Write down this information.  Do not use tampons.  Keep all follow-up appointments as directed by your health care provider. Your health care provider may ask you to have follow-up blood tests or ultrasound tests or both. SEEK IMMEDIATE MEDICAL CARE IF:   You have severe cramps in your stomach, back, abdomen, or pelvis.  You have a fever.  You pass large clots or tissue. Save any tissue for your health care provider to look at.  Your bleeding increases or you become lightheaded, feel weak, or have fainting episodes. Document Released: 03/29/2007 Document Revised: 10/02/2013 Document Reviewed: 07/11/2013 [facility] Patient Information 2015 [employer], [place]. This information is not intended to replace advice given to you by your health care provider. Make sure you discuss any questions you have with your health care provider.

## 2014-07-16 NOTE — Progress Notes (Signed)
  Subjective:  Claire Weber is a 28 y.o. G2P0010 African American female at 4834w1d by today's u/s, being seen today for her first obstetrical visit.  Her obstetrical history is significant for obesity and TAB 11/2013.  Pregnancy history fully reviewed.  Patient reports some mild nausea, no vomiting- declines meds. [facility]- was dx w/ BV- given metrogel, hasn't started yet. Is not having any abnormal/malodorous vag d/c, or vulvovaginal itching/irritation. No vb since this weekend. Denies cramping, uti s/s.   BP 104/68  Wt 214 lb (97.07 kg)  LMP 05/27/2014  HISTORY: OB History  Gravida Para Term Preterm AB SAB TAB Ectopic Multiple Living  2    1  1        # Outcome Date GA Lbr Len/2nd Weight Sex Delivery Anes PTL Lv  2 CUR           1 TAB 2014             Past Medical History  Diagnosis Date  . Medical history non-contributory    Past Surgical History  Procedure Laterality Date  . Abdominal hernia repair      at age 28   Family History  Problem Relation Age of Onset  . Hypertension Mother   . Hyperlipidemia Mother   . Hypertension Father   . Diabetes Father     Exam   System:     General: Well developed & nourished, no acute distress   Skin: Warm & dry, normal coloration and turgor, no rashes   Neurologic: Alert & oriented, normal mood   Cardiovascular: Regular rate & rhythm   Respiratory: Effort & rate normal, LCTAB, acyanotic   Abdomen: Soft, non tender   Extremities: normal strength, tone   Pelvic Exam:    Perineum: Normal perineum   Vulva: Normal, no lesions   Vagina:  Normal mucosa, normal discharge   Cervix: Normal, bulbous, appears closed   Uterus: Normal size/shape/contour for GA   Thin prep pap smear Jan 2014 neg, [facility]  FHR: 114 via u/s   Assessment:   Pregnancy: G2P0010 Patient Active Problem List   Diagnosis Date Noted  . Supervision of other normal pregnancy 07/16/2014   Priority: High  . Subchorionic hemorrhage 07/16/2014    Priority: High    2534w1d G2P0010 [facility], Rh+ Mild nausea of pregnancy BV dx this weekend  Plan:  Initial labs drawn Continue prenatal vitamins Problem list reviewed and updated Reviewed n/v relief measures and warning s/s to report Reviewed recommended weight gain based on pre-gravid BMI Encouraged well-balanced diet Genetic Screening discussed Integrated Screen: requested Cystic fibrosis screening discussed requested Ultrasound discussed; fetal survey: requested Follow up in 3 weeks for lrob, then 6wks for 1st it/nt and lrob CCNC completed Pelvic rest Discussed warning s/s to report w/ vb OK not do metrogel, unless becomes symptomatic w/ BV  Marge DuncansBooker, Kimberly Randall CNM, [facility] JoseWHNP-BC 07/16/2014 3:59 PM

## 2014-07-16 NOTE — Progress Notes (Signed)
U/S(6+2wks)-single IUP with +FCA noted, FHR-114bpm, CRL c/w previous u/s dates, (EDD 03/09/2015), cx appears closed, bilateral adnexa appears WNL, small sub-chorionic hemorrhage noted=7.986mm adjacent to GS

## 2014-07-17 LAB — HIV ANTIBODY (ROUTINE TESTING W REFLEX): HIV: NONREACTIVE

## 2014-07-17 LAB — SICKLE CELL SCREEN: Sickle Cell Screen: NEGATIVE

## 2014-07-17 LAB — DRUG SCREEN, URINE, NO CONFIRMATION
Amphetamine Screen, Ur: NEGATIVE
Barbiturate Quant, Ur: NEGATIVE
Benzodiazepines.: NEGATIVE
COCAINE METABOLITES: NEGATIVE
Creatinine,U: 296.3 mg/dL
Marijuana Metabolite: NEGATIVE
Methadone: NEGATIVE
Opiate Screen, Urine: NEGATIVE
PHENCYCLIDINE (PCP): NEGATIVE
Propoxyphene: NEGATIVE

## 2014-07-17 LAB — OXYCODONE SCREEN, UA, RFLX CONFIRM: Oxycodone Screen, Ur: NEGATIVE ng/mL

## 2014-07-17 LAB — VARICELLA ZOSTER ANTIBODY, IGG: Varicella IgG: 284.5 Index — ABNORMAL HIGH (ref <135.00)

## 2014-07-17 LAB — ANTIBODY SCREEN: Antibody Screen: NEGATIVE

## 2014-07-17 LAB — HEPATITIS B SURFACE ANTIGEN: HEP B S AG: NEGATIVE

## 2014-07-17 LAB — RUBELLA SCREEN: Rubella: 0.23 Index (ref <0.90)

## 2014-07-17 LAB — RPR

## 2014-07-21 ENCOUNTER — Encounter: Payer: Self-pay | Admitting: Women's Health

## 2014-07-21 DIAGNOSIS — O9989 Other specified diseases and conditions complicating pregnancy, childbirth and the puerperium: Secondary | ICD-10-CM

## 2014-07-21 DIAGNOSIS — Z2839 Other underimmunization status: Secondary | ICD-10-CM | POA: Insufficient documentation

## 2014-07-21 DIAGNOSIS — Z283 Underimmunization status: Secondary | ICD-10-CM | POA: Insufficient documentation

## 2014-07-21 LAB — CYSTIC FIBROSIS DIAGNOSTIC STUDY

## 2014-07-22 ENCOUNTER — Encounter: Payer: Self-pay | Admitting: Women's Health

## 2014-07-28 ENCOUNTER — Telehealth: Payer: Self-pay | Admitting: Women's Health

## 2014-07-28 ENCOUNTER — Telehealth: Payer: Self-pay | Admitting: *Deleted

## 2014-07-29 MED ORDER — DOXYLAMINE-PYRIDOXINE 10-10 MG PO TBEC: DELAYED_RELEASE_TABLET | ORAL | Status: DC

## 2014-07-29 MED ORDER — DOXYLAMINE-PYRIDOXINE 10-10 MG PO TBEC: 10.0000 mg | DELAYED_RELEASE_TABLET | ORAL | Status: DC

## 2014-07-29 NOTE — Telephone Encounter (Signed)
Pt states taking phenergan for N/V but makes her drowsy would like a RX for Diclegis. Informed pt will leave some samples at front desk for pt to pick up and will request RX to be sent to pharmacy.

## 2014-08-05 ENCOUNTER — Encounter: Payer: Self-pay | Admitting: Women's Health

## 2014-08-05 ENCOUNTER — Ambulatory Visit (INDEPENDENT_AMBULATORY_CARE_PROVIDER_SITE_OTHER): Payer: PRIVATE HEALTH INSURANCE | Admitting: Women's Health

## 2014-08-05 VITALS — BP 116/90 | Wt 218.0 lb

## 2014-08-05 DIAGNOSIS — Z1389 Encounter for screening for other disorder: Secondary | ICD-10-CM

## 2014-08-05 DIAGNOSIS — Z36 Encounter for antenatal screening of mother: Secondary | ICD-10-CM

## 2014-08-05 DIAGNOSIS — Z3481 Encounter for supervision of other normal pregnancy, first trimester: Secondary | ICD-10-CM

## 2014-08-05 DIAGNOSIS — Z331 Pregnant state, incidental: Secondary | ICD-10-CM

## 2014-08-05 DIAGNOSIS — Z3480 Encounter for supervision of other normal pregnancy, unspecified trimester: Secondary | ICD-10-CM

## 2014-08-05 LAB — POCT URINALYSIS DIPSTICK
Blood, UA: NEGATIVE
Glucose, UA: NEGATIVE
KETONES UA: NEGATIVE
LEUKOCYTES UA: NEGATIVE
Nitrite, UA: NEGATIVE
PROTEIN UA: NEGATIVE

## 2014-08-05 NOTE — Patient Instructions (Signed)
First Trimester of Pregnancy The first trimester of pregnancy is from week 1 until the end of week 12 (months 1 through 3). A week after a sperm fertilizes an egg, the egg will implant on the wall of the uterus. This embryo will begin to develop into a baby. Genes from you and your partner are forming the baby. The female genes determine whether the baby is a boy or a girl. At 6-8 weeks, the eyes and face are formed, and the heartbeat can be seen on ultrasound. At the end of 12 weeks, all the baby's organs are formed.  Now that you are pregnant, you will want to do everything you can to have a healthy baby. Two of the most important things are to get good prenatal care and to follow your health care provider's instructions. Prenatal care is all the medical care you receive before the baby's birth. This care will help prevent, find, and treat any problems during the pregnancy and childbirth. BODY CHANGES Your body goes through many changes during pregnancy. The changes vary from woman to woman.   You may gain or lose a couple of pounds at first.  You may feel sick to your stomach (nauseous) and throw up (vomit). If the vomiting is uncontrollable, call your health care provider.  You may tire easily.  You may develop headaches that can be relieved by medicines approved by your health care provider.  You may urinate more often. Painful urination may mean you have a bladder infection.  You may develop heartburn as a result of your pregnancy.  You may develop constipation because certain hormones are causing the muscles that push waste through your intestines to slow down.  You may develop hemorrhoids or swollen, bulging veins (varicose veins).  Your breasts may begin to grow larger and become tender. Your nipples may stick out more, and the tissue that surrounds them (areola) may become darker.  Your gums may bleed and may be sensitive to brushing and flossing.  Dark spots or blotches (chloasma,  mask of pregnancy) may develop on your face. This will likely fade after the baby is born.  Your menstrual periods will stop.  You may have a loss of appetite.  You may develop cravings for certain kinds of food.  You may have changes in your emotions from day to day, such as being excited to be pregnant or being concerned that something may go wrong with the pregnancy and baby.  You may have more vivid and strange dreams.  You may have changes in your hair. These can include thickening of your hair, rapid growth, and changes in texture. Some women also have hair loss during or after pregnancy, or hair that feels dry or thin. Your hair will most likely return to normal after your baby is born. WHAT TO EXPECT AT YOUR PRENATAL VISITS During a routine prenatal visit:  You will be weighed to make sure you and the baby are growing normally.  Your blood pressure will be taken.  Your abdomen will be measured to track your baby's growth.  The fetal heartbeat will be listened to starting around week 10 or 12 of your pregnancy.  Test results from any previous visits will be discussed. Your health care provider may ask you:  How you are feeling.  If you are feeling the baby move.  If you have had any abnormal symptoms, such as leaking fluid, bleeding, severe headaches, or abdominal cramping.  If you have any questions. Other tests   that may be performed during your first trimester include:  Blood tests to find your blood type and to check for the presence of any previous infections. They will also be used to check for low iron levels (anemia) and Rh antibodies. Later in the pregnancy, blood tests for diabetes will be done along with other tests if problems develop.  Urine tests to check for infections, diabetes, or protein in the urine.  An ultrasound to confirm the proper growth and development of the baby.  An amniocentesis to check for possible genetic problems.  Fetal screens for  spina bifida and Down syndrome.  You may need other tests to make sure you and the baby are doing well. HOME CARE INSTRUCTIONS  Medicines  Follow your health care provider's instructions regarding medicine use. Specific medicines may be either safe or unsafe to take during pregnancy.  Take your prenatal vitamins as directed.  If you develop constipation, try taking a stool softener if your health care provider approves. Diet  Eat regular, well-balanced meals. Choose a variety of foods, such as meat or vegetable-based protein, fish, milk and low-fat dairy products, vegetables, fruits, and whole grain breads and cereals. Your health care provider will help you determine the amount of weight gain that is right for you.  Avoid raw meat and uncooked cheese. These carry germs that can cause birth defects in the baby.  Eating four or five small meals rather than three large meals a day may help relieve nausea and vomiting. If you start to feel nauseous, eating a few soda crackers can be helpful. Drinking liquids between meals instead of during meals also seems to help nausea and vomiting.  If you develop constipation, eat more high-fiber foods, such as fresh vegetables or fruit and whole grains. Drink enough fluids to keep your urine clear or pale yellow. Activity and Exercise  Exercise only as directed by your health care provider. Exercising will help you:  Control your weight.  Stay in shape.  Be prepared for labor and delivery.  Experiencing pain or cramping in the lower abdomen or low back is a good sign that you should stop exercising. Check with your health care provider before continuing normal exercises.  Try to avoid standing for long periods of time. Move your legs often if you must stand in one place for a long time.  Avoid heavy lifting.  Wear low-heeled shoes, and practice good posture.  You may continue to have sex unless your health care provider directs you  otherwise. Relief of Pain or Discomfort  Wear a good support bra for breast tenderness.   Take warm sitz baths to soothe any pain or discomfort caused by hemorrhoids. Use hemorrhoid cream if your health care provider approves.   Rest with your legs elevated if you have leg cramps or low back pain.  If you develop varicose veins in your legs, wear support hose. Elevate your feet for 15 minutes, 3-4 times a day. Limit salt in your diet. Prenatal Care  Schedule your prenatal visits by the twelfth week of pregnancy. They are usually scheduled monthly at first, then more often in the last 2 months before delivery.  Write down your questions. Take them to your prenatal visits.  Keep all your prenatal visits as directed by your health care provider. Safety  Wear your seat belt at all times when driving.  Make a list of emergency phone numbers, including numbers for family, friends, the hospital, and police and fire departments. General Tips    Ask your health care provider for a referral to a local prenatal education class. Begin classes no later than at the beginning of month 6 of your pregnancy.  Ask for help if you have counseling or nutritional needs during pregnancy. Your health care provider can offer advice or refer you to specialists for help with various needs.  Do not use hot tubs, steam rooms, or saunas.  Do not douche or use tampons or scented sanitary pads.  Do not cross your legs for long periods of time.  Avoid cat litter boxes and soil used by cats. These carry germs that can cause birth defects in the baby and possibly loss of the fetus by miscarriage or stillbirth.  Avoid all smoking, herbs, alcohol, and medicines not prescribed by your health care provider. Chemicals in these affect the formation and growth of the baby.  Schedule a dentist appointment. At home, brush your teeth with a soft toothbrush and be gentle when you floss. SEEK MEDICAL CARE IF:   You have  dizziness.  You have mild pelvic cramps, pelvic pressure, or nagging pain in the abdominal area.  You have persistent nausea, vomiting, or diarrhea.  You have a bad smelling vaginal discharge.  You have pain with urination.  You notice increased swelling in your face, hands, legs, or ankles. SEEK IMMEDIATE MEDICAL CARE IF:   You have a fever.  You are leaking fluid from your vagina.  You have spotting or bleeding from your vagina.  You have severe abdominal cramping or pain.  You have rapid weight gain or loss.  You vomit blood or material that looks like coffee grounds.  You are exposed to German measles and have never had them.  You are exposed to fifth disease or chickenpox.  You develop a severe headache.  You have shortness of breath.  You have any kind of trauma, such as from a fall or a car accident. Document Released: 12/06/2001 Document Revised: 04/28/2014 Document Reviewed: 10/22/2013 Exit[employer] Patient Information 2015 [employer]. This information is not intended to replace advice given to you by your health care provider. Make sure you discuss any questions you have with your health care provider.  

## 2014-08-05 NOTE — Progress Notes (Addendum)
Low-risk OB appointment [id number] Estimated Date of Delivery: 03/10/15 BP 116/90  Wt 218 lb (98.884 kg)  LMP 05/27/2014  BP, weight, and urine reviewed.  Refer to obstetrical flow sheet for FH & FHR.  Denies cramping, lof, vb, or uti s/s. No spotting in 2wks! Lots of fatigue.  Taking diclegis prn, not as directed, still having n/v. Reviewed correct way to take, to call us if n/v not improving. Reviewed warning s/s to report. Plan:  Continue routine obstetrical care  F/U in 3wks for OB appointment and 1st nt/it

## 2014-08-08 ENCOUNTER — Encounter: Payer: PRIVATE HEALTH INSURANCE | Admitting: Obstetrics and Gynecology

## 2014-08-18 ENCOUNTER — Telehealth: Payer: Self-pay | Admitting: Obstetrics and Gynecology

## 2014-08-18 NOTE — Telephone Encounter (Signed)
Pt was requesting samples of Diclegis, she states we gave her some a couple weeks ago but is now out, I informed her that when the samples were left for her there was also a prescription sent to [facility] and she can pick up the medication there.

## 2014-08-26 ENCOUNTER — Other Ambulatory Visit: Payer: PRIVATE HEALTH INSURANCE

## 2014-08-26 ENCOUNTER — Ambulatory Visit (INDEPENDENT_AMBULATORY_CARE_PROVIDER_SITE_OTHER): Payer: PRIVATE HEALTH INSURANCE

## 2014-08-26 DIAGNOSIS — Z331 Pregnant state, incidental: Secondary | ICD-10-CM

## 2014-08-26 DIAGNOSIS — Z36 Encounter for antenatal screening of mother: Secondary | ICD-10-CM

## 2014-08-26 NOTE — Progress Notes (Signed)
U/S(12+0wks)-single active fetus, CRL c/w dates, cx appears closed, bilateral adnexa appears WNL, posterior Gr 0 placenta, NB present, NT-1.26mm, FHR-161 bpm

## 2014-08-27 ENCOUNTER — Other Ambulatory Visit: Payer: PRIVATE HEALTH INSURANCE

## 2014-08-27 ENCOUNTER — Encounter: Payer: PRIVATE HEALTH INSURANCE | Admitting: Advanced Practice Midwife

## 2014-08-30 LAB — MATERNAL SCREEN, INTEGRATED #1

## 2014-09-08 ENCOUNTER — Ambulatory Visit (INDEPENDENT_AMBULATORY_CARE_PROVIDER_SITE_OTHER): Payer: PRIVATE HEALTH INSURANCE | Admitting: Women's Health

## 2014-09-08 ENCOUNTER — Encounter: Payer: Self-pay | Admitting: Women's Health

## 2014-09-08 VITALS — BP 102/78 | Wt 217.0 lb

## 2014-09-08 DIAGNOSIS — O2390 Unspecified genitourinary tract infection in pregnancy, unspecified trimester: Secondary | ICD-10-CM

## 2014-09-08 DIAGNOSIS — B9689 Other specified bacterial agents as the cause of diseases classified elsewhere: Secondary | ICD-10-CM

## 2014-09-08 DIAGNOSIS — N760 Acute vaginitis: Secondary | ICD-10-CM

## 2014-09-08 DIAGNOSIS — Z1389 Encounter for screening for other disorder: Secondary | ICD-10-CM

## 2014-09-08 DIAGNOSIS — Z3482 Encounter for supervision of other normal pregnancy, second trimester: Secondary | ICD-10-CM

## 2014-09-08 DIAGNOSIS — Z331 Pregnant state, incidental: Secondary | ICD-10-CM

## 2014-09-08 DIAGNOSIS — N898 Other specified noninflammatory disorders of vagina: Secondary | ICD-10-CM

## 2014-09-08 DIAGNOSIS — Z3480 Encounter for supervision of other normal pregnancy, unspecified trimester: Secondary | ICD-10-CM

## 2014-09-08 LAB — POCT WET PREP (WET MOUNT): Clue Cells Wet Prep Whiff POC: POSITIVE

## 2014-09-08 LAB — POCT URINALYSIS DIPSTICK
Blood, UA: NEGATIVE
GLUCOSE UA: NEGATIVE
Ketones, UA: NEGATIVE
LEUKOCYTES UA: NEGATIVE
NITRITE UA: NEGATIVE
Protein, UA: NEGATIVE

## 2014-09-08 MED ORDER — METRONIDAZOLE 500 MG PO TABS
500.0000 mg | ORAL_TABLET | Freq: Two times a day (BID) | ORAL | Status: DC
Start: 2014-09-08 — End: 2014-12-17

## 2014-09-08 NOTE — Progress Notes (Signed)
Low-risk OB appointment [id number] [redacted]w[redacted]d Estimated Date of Delivery: 03/10/15 BP 102/78  Wt 217 lb (98.431 kg)  LMP 05/27/2014  BP, weight, and urine reviewed.  Refer to obstetrical flow sheet for FH & FHR.  Had 1st it labs and u/s 9/1, but states had to reschedule appt.  Reports good fm.  Denies regular uc's, lof, vb, or uti s/s. + malodorous d/c 'for awhile'. Cold x 4-5 days, more chest congestion and sneezing than anything, some productive cough. No fever/chills, earache, sore throat.  Recommended mucinex, increasing fluids.  Spec exam: mod amount thin white malodorous d/c  Wet prep: mod clues Rx flagyl bid x 7d Reviewed warning s/s to report. Plan:  Continue routine obstetrical care  F/U in 3wks for OB appointment and 2nd IT

## 2014-09-08 NOTE — Patient Instructions (Addendum)
Bacterial Vaginosis °Bacterial vaginosis is a vaginal infection that occurs when the normal balance of bacteria in the vagina is disrupted. It results from an overgrowth of certain bacteria. This is the most common vaginal infection in women of childbearing age. Treatment is important to prevent complications, especially in pregnant women, as it can cause a premature delivery. °CAUSES  °Bacterial vaginosis is caused by an increase in harmful bacteria that are normally present in smaller amounts in the vagina. Several different kinds of bacteria can cause bacterial vaginosis. However, the reason that the condition develops is not fully understood. °RISK FACTORS °Certain activities or behaviors can put you at an increased risk of developing bacterial vaginosis, including: °· Having a new sex partner or multiple sex partners. °· Douching. °· Using an intrauterine device (IUD) for contraception. °Women do not get bacterial vaginosis from toilet seats, bedding, swimming pools, or contact with objects around them. °SIGNS AND SYMPTOMS  °Some women with bacterial vaginosis have no signs or symptoms. Common symptoms include: °· Grey vaginal discharge. °· A fishlike odor with discharge, especially after sexual intercourse. °· Itching or burning of the vagina and vulva. °· Burning or pain with urination. °DIAGNOSIS  °Your health care provider will take a medical history and examine the vagina for signs of bacterial vaginosis. A sample of vaginal fluid may be taken. Your health care provider will look at this sample under a microscope to check for bacteria and abnormal cells. A vaginal pH test may also be done.  °TREATMENT  °Bacterial vaginosis may be treated with antibiotic medicines. These may be given in the form of a pill or a vaginal cream. A second round of antibiotics may be prescribed if the condition comes back after treatment.  °HOME CARE INSTRUCTIONS  °· Only take over-the-counter or prescription medicines as  directed by your health care provider. °· If antibiotic medicine was prescribed, take it as directed. Make sure you finish it even if you start to feel better. °· Do not have sex until treatment is completed. °· Tell all sexual partners that you have a vaginal infection. They should see their health care provider and be treated if they have problems, such as a mild rash or itching. °· Practice safe sex by using condoms and only having one sex partner. °SEEK MEDICAL CARE IF:  °· Your symptoms are not improving after 3 days of treatment. °· You have increased discharge or pain. °· You have a fever. °MAKE SURE YOU:  °· Understand these instructions. °· Will watch your condition. °· Will get help right away if you are not doing well or get worse. °FOR MORE INFORMATION  °Centers for Disease Control and Prevention, Division of STD Prevention: www.cdc.gov/std °American Sexual Health Association (ASHA): www.ashastd.org  °Document Released: 12/12/2005 Document Revised: 10/02/2013 Document Reviewed: 07/24/2013 °ExitCare® Patient Information ©2015 [employer]. This information is not intended to replace advice given to you by your health care provider. Make sure you discuss any questions you have with your health care provider. °Second Trimester of Pregnancy °The second trimester is from week 13 through week 28, months 4 through 6. The second trimester is often a time when you feel your best. Your body has also adjusted to being pregnant, and you begin to feel better physically. Usually, morning sickness has lessened or quit completely, you may have more energy, and you may have an increase in appetite. The second trimester is also a time when the fetus is growing rapidly. At the end of the sixth   month, the fetus is about 9 inches long and weighs about 1½ pounds. You will likely begin to feel the baby move (quickening) between 18 and 20 weeks of the pregnancy. °BODY CHANGES °Your body goes through many changes during  pregnancy. The changes vary from woman to woman.  °· Your weight will continue to increase. You will notice your lower abdomen bulging out. °· You may begin to get stretch marks on your hips, abdomen, and breasts. °· You may develop headaches that can be relieved by medicines approved by your health care provider. °· You may urinate more often because the fetus is pressing on your bladder. °· You may develop or continue to have heartburn as a result of your pregnancy. °· You may develop constipation because certain hormones are causing the muscles that push waste through your intestines to slow down. °· You may develop hemorrhoids or swollen, bulging veins (varicose veins). °· You may have back pain because of the weight gain and pregnancy hormones relaxing your joints between the bones in your pelvis and as a result of a shift in weight and the muscles that support your balance. °· Your breasts will continue to grow and be tender. °· Your gums may bleed and may be sensitive to brushing and flossing. °· Dark spots or blotches (chloasma, mask of pregnancy) may develop on your face. This will likely fade after the baby is born. °· A dark line from your belly button to the pubic area (linea nigra) may appear. This will likely fade after the baby is born. °· You may have changes in your hair. These can include thickening of your hair, rapid growth, and changes in texture. Some women also have hair loss during or after pregnancy, or hair that feels dry or thin. Your hair will most likely return to normal after your baby is born. °WHAT TO EXPECT AT YOUR PRENATAL VISITS °During a routine prenatal visit: °· You will be weighed to make sure you and the fetus are growing normally. °· Your blood pressure will be taken. °· Your abdomen will be measured to track your baby's growth. °· The fetal heartbeat will be listened to. °· Any test results from the previous visit will be discussed. °Your health care provider may ask  you: °· How you are feeling. °· If you are feeling the baby move. °· If you have had any abnormal symptoms, such as leaking fluid, bleeding, severe headaches, or abdominal cramping. °· If you have any questions. °Other tests that may be performed during your second trimester include: °· Blood tests that check for: °¨ Low iron levels (anemia). °¨ Gestational diabetes (between 24 and 28 weeks). °¨ Rh antibodies. °· Urine tests to check for infections, diabetes, or protein in the urine. °· An ultrasound to confirm the proper growth and development of the baby. °· An amniocentesis to check for possible genetic problems. °· Fetal screens for spina bifida and Down syndrome. °HOME CARE INSTRUCTIONS  °· Avoid all smoking, herbs, alcohol, and unprescribed drugs. These chemicals affect the formation and growth of the baby. °· Follow your health care provider's instructions regarding medicine use. There are medicines that are either safe or unsafe to take during pregnancy. °· Exercise only as directed by your health care provider. Experiencing uterine cramps is a good sign to stop exercising. °· Continue to eat regular, healthy meals. °· Wear a good support bra for breast tenderness. °· Do not use hot tubs, steam rooms, or saunas. °· Wear your seat   belt at all times when driving. °· Avoid raw meat, uncooked cheese, cat litter boxes, and soil used by cats. These carry germs that can cause birth defects in the baby. °· Take your prenatal vitamins. °· Try taking a stool softener (if your health care provider approves) if you develop constipation. Eat more high-fiber foods, such as fresh vegetables or fruit and whole grains. Drink plenty of fluids to keep your urine clear or pale yellow. °· Take warm sitz baths to soothe any pain or discomfort caused by hemorrhoids. Use hemorrhoid cream if your health care provider approves. °· If you develop varicose veins, wear support hose. Elevate your feet for 15 minutes, 3-4 times a day.  Limit salt in your diet. °· Avoid heavy lifting, wear low heel shoes, and practice good posture. °· Rest with your legs elevated if you have leg cramps or low back pain. °· Visit your dentist if you have not gone yet during your pregnancy. Use a soft toothbrush to brush your teeth and be gentle when you floss. °· A sexual relationship may be continued unless your health care provider directs you otherwise. °· Continue to go to all your prenatal visits as directed by your health care provider. °SEEK MEDICAL CARE IF:  °· You have dizziness. °· You have mild pelvic cramps, pelvic pressure, or nagging pain in the abdominal area. °· You have persistent nausea, vomiting, or diarrhea. °· You have a bad smelling vaginal discharge. °· You have pain with urination. °SEEK IMMEDIATE MEDICAL CARE IF:  °· You have a fever. °· You are leaking fluid from your vagina. °· You have spotting or bleeding from your vagina. °· You have severe abdominal cramping or pain. °· You have rapid weight gain or loss. °· You have shortness of breath with chest pain. °· You notice sudden or extreme swelling of your face, hands, ankles, feet, or legs. °· You have not felt your baby move in over an hour. °· You have severe headaches that do not go away with medicine. °· You have vision changes. °Document Released: 12/06/2001 Document Revised: 12/17/2013 Document Reviewed: 02/12/2013 °ExitCare® Patient Information ©2015 [employer]. This information is not intended to replace advice given to you by your health care provider. Make sure you discuss any questions you have with your health care provider. ° °

## 2014-09-11 ENCOUNTER — Telehealth: Payer: Self-pay | Admitting: Women's Health

## 2014-09-11 MED ORDER — CLINDAMYCIN PHOSPHATE 100 MG VA SUPP: 100.0000 mg | Freq: Every day | VAGINAL | Status: DC

## 2014-09-11 NOTE — Telephone Encounter (Signed)
Pt states Claire Weber, CNM prescribed Metronidazole tablets for BV, pt states she is unable to keep pills down and asking if there is an alternative. Pt states she has taken the tablets with crackers, water, also taking Diclegis and nothing helps.

## 2014-09-11 NOTE — Telephone Encounter (Signed)
Cleocin ovules sent to pharmacy.

## 2014-09-11 NOTE — Telephone Encounter (Signed)
Pt informed Cleocin suppositories e-scribed.

## 2014-09-12 ENCOUNTER — Emergency Department (HOSPITAL_COMMUNITY)
Admission: EM | Admit: 2014-09-12 | Discharge: 2014-09-12 | Discharge status: Absent without leave | Payer: BC Managed Care – PPO | Source: Home / Self Care

## 2014-09-29 ENCOUNTER — Ambulatory Visit (INDEPENDENT_AMBULATORY_CARE_PROVIDER_SITE_OTHER): Payer: PRIVATE HEALTH INSURANCE | Admitting: Women's Health

## 2014-09-29 ENCOUNTER — Encounter: Payer: Self-pay | Admitting: Women's Health

## 2014-09-29 VITALS — BP 118/70 | Wt 221.0 lb

## 2014-09-29 DIAGNOSIS — Z3482 Encounter for supervision of other normal pregnancy, second trimester: Secondary | ICD-10-CM

## 2014-09-29 DIAGNOSIS — Z3682 Encounter for antenatal screening for nuchal translucency: Secondary | ICD-10-CM

## 2014-09-29 DIAGNOSIS — Z1389 Encounter for screening for other disorder: Secondary | ICD-10-CM

## 2014-09-29 DIAGNOSIS — Z363 Encounter for antenatal screening for malformations: Secondary | ICD-10-CM

## 2014-09-29 DIAGNOSIS — Z331 Pregnant state, incidental: Secondary | ICD-10-CM

## 2014-09-29 LAB — POCT URINALYSIS DIPSTICK
Blood, UA: NEGATIVE
Glucose, UA: NEGATIVE
Ketones, UA: NEGATIVE
LEUKOCYTES UA: NEGATIVE
Nitrite, UA: NEGATIVE

## 2014-09-29 NOTE — Progress Notes (Signed)
Low-risk OB appointment [id number] Estimated Date of Delivery: 03/10/15 BP 118/70  Wt 221 lb (100.245 kg)  LMP 05/27/2014  BP, weight, and urine reviewed.  Refer to obstetrical flow sheet for FH & FHR.  No fm yet. Denies cramping, lof, vb, or uti s/s.Some Lt lower back pain when turning from side to side in bed, no other time. Mild ha's, 1 extra strength apap gets rid of it til next day. Getting flu shot at work on 10/12- gave note stating ok to receive.  BV last visit, couldn't keep flagyl down, was rx'd metrogel by another provider- didn't get b/c was $50- recommended getting if possible d/t possible r/f ptl w/ BV during pregnancy.  Reviewed warning s/s to report. Plan:  Continue routine obstetrical care  F/U in 4wks for OB appointment and anatomy u/s 2nd IT today

## 2014-09-29 NOTE — Patient Instructions (Signed)
Second Trimester of Pregnancy The second trimester is from week 13 through week 28, months 4 through 6. The second trimester is often a time when you feel your best. Your body has also adjusted to being pregnant, and you begin to feel better physically. Usually, morning sickness has lessened or quit completely, you may have more energy, and you may have an increase in appetite. The second trimester is also a time when the fetus is growing rapidly. At the end of the sixth month, the fetus is about 9 inches long and weighs about 1 pounds. You will likely begin to feel the baby move (quickening) between 18 and 20 weeks of the pregnancy. BODY CHANGES Your body goes through many changes during pregnancy. The changes vary from woman to woman.   Your weight will continue to increase. You will notice your lower abdomen bulging out.  You may begin to get stretch marks on your hips, abdomen, and breasts.  You may develop headaches that can be relieved by medicines approved by your health care provider.  You may urinate more often because the fetus is pressing on your bladder.  You may develop or continue to have heartburn as a result of your pregnancy.  You may develop constipation because certain hormones are causing the muscles that push waste through your intestines to slow down.  You may develop hemorrhoids or swollen, bulging veins (varicose veins).  You may have back pain because of the weight gain and pregnancy hormones relaxing your joints between the bones in your pelvis and as a result of a shift in weight and the muscles that support your balance.  Your breasts will continue to grow and be tender.  Your gums may bleed and may be sensitive to brushing and flossing.  Dark spots or blotches (chloasma, mask of pregnancy) may develop on your face. This will likely fade after the baby is born.  A dark line from your belly button to the pubic area (linea nigra) may appear. This will likely fade  after the baby is born.  You may have changes in your hair. These can include thickening of your hair, rapid growth, and changes in texture. Some women also have hair loss during or after pregnancy, or hair that feels dry or thin. Your hair will most likely return to normal after your baby is born. WHAT TO EXPECT AT YOUR PRENATAL VISITS During a routine prenatal visit:  You will be weighed to make sure you and the fetus are growing normally.  Your blood pressure will be taken.  Your abdomen will be measured to track your baby's growth.  The fetal heartbeat will be listened to.  Any test results from the previous visit will be discussed. Your health care provider may ask you:  How you are feeling.  If you are feeling the baby move.  If you have had any abnormal symptoms, such as leaking fluid, bleeding, severe headaches, or abdominal cramping.  If you have any questions. Other tests that may be performed during your second trimester include:  Blood tests that check for:  Low iron levels (anemia).  Gestational diabetes (between 24 and 28 weeks).  Rh antibodies.  Urine tests to check for infections, diabetes, or protein in the urine.  An ultrasound to confirm the proper growth and development of the baby.  An amniocentesis to check for possible genetic problems.  Fetal screens for spina bifida and Down syndrome. HOME CARE INSTRUCTIONS   Avoid all smoking, herbs, alcohol, and unprescribed   drugs. These chemicals affect the formation and growth of the baby.  Follow your health care provider's instructions regarding medicine use. There are medicines that are either safe or unsafe to take during pregnancy.  Exercise only as directed by your health care provider. Experiencing uterine cramps is a good sign to stop exercising.  Continue to eat regular, healthy meals.  Wear a good support bra for breast tenderness.  Do not use hot tubs, steam rooms, or saunas.  Wear your  seat belt at all times when driving.  Avoid raw meat, uncooked cheese, cat litter boxes, and soil used by cats. These carry germs that can cause birth defects in the baby.  Take your prenatal vitamins.  Try taking a stool softener (if your health care provider approves) if you develop constipation. Eat more high-fiber foods, such as fresh vegetables or fruit and whole grains. Drink plenty of fluids to keep your urine clear or pale yellow.  Take warm sitz baths to soothe any pain or discomfort caused by hemorrhoids. Use hemorrhoid cream if your health care provider approves.  If you develop varicose veins, wear support hose. Elevate your feet for 15 minutes, 3-4 times a day. Limit salt in your diet.  Avoid heavy lifting, wear low heel shoes, and practice good posture.  Rest with your legs elevated if you have leg cramps or low back pain.  Visit your dentist if you have not gone yet during your pregnancy. Use a soft toothbrush to brush your teeth and be gentle when you floss.  A sexual relationship may be continued unless your health care provider directs you otherwise.  Continue to go to all your prenatal visits as directed by your health care provider. SEEK MEDICAL CARE IF:   You have dizziness.  You have mild pelvic cramps, pelvic pressure, or nagging pain in the abdominal area.  You have persistent nausea, vomiting, or diarrhea.  You have a bad smelling vaginal discharge.  You have pain with urination. SEEK IMMEDIATE MEDICAL CARE IF:   You have a fever.  You are leaking fluid from your vagina.  You have spotting or bleeding from your vagina.  You have severe abdominal cramping or pain.  You have rapid weight gain or loss.  You have shortness of breath with chest pain.  You notice sudden or extreme swelling of your face, hands, ankles, feet, or legs.  You have not felt your baby move in over an hour.  You have severe headaches that do not go away with  medicine.  You have vision changes. Document Released: 12/06/2001 Document Revised: 12/17/2013 Document Reviewed: 02/12/2013 [employer] Patient Information 2015 [employer]. This information is not intended to replace advice given to you by your health care provider. Make sure you discuss any questions you have with your health care provider.  

## 2014-10-02 ENCOUNTER — Telehealth: Payer: Self-pay | Admitting: *Deleted

## 2014-10-02 LAB — MATERNAL SCREEN, INTEGRATED #2
AFP MOM MAT SCREEN: 1.63
AFP, Serum: 52.4 ng/mL
Age risk Down Syndrome: 1:880 {titer}
CROWN RUMP LENGTH MAT SCREEN 2: 54.8 mm
Calculated Gestational Age: 16.7
ESTRIOL MOM MAT SCREEN: 1.57
Estriol, Free: 1.34 ng/mL
HCG, MOM MAT SCREEN: 0.78
Inhibin A Dimeric: 90 pg/mL
Inhibin A MoM: 0.64
MSS Down Syndrome: <1:5000 {titer}
MSS Trisomy 18 Risk: <1:5000 {titer}
NT MoM: 1.28
NUCHAL TRANSLUCENCY MAT SCREEN 2: 1.66 mm
NUMBER OF FETUSES MAT SCREEN 2: 1
PAPP-A MAT SCREEN: 692 ng/mL
PAPP-A MoM: 1.4
Rish for ONTD: 1:1800 {titer}
hCG, Serum: 23.2 IU/mL

## 2014-10-02 NOTE — Telephone Encounter (Signed)
Left message x 1. JSY 

## 2014-10-04 ENCOUNTER — Encounter: Payer: Self-pay | Admitting: Women's Health

## 2014-10-06 MED ORDER — CONCEPT DHA 53.5-38-1 MG PO CAPS: 1.0000 | ORAL_CAPSULE | Freq: Every day | ORAL | Status: DC

## 2014-10-06 NOTE — Telephone Encounter (Signed)
Spoke with pt letting her know prenatal vit was sent to pharmacy. JSY

## 2014-10-06 NOTE — Telephone Encounter (Signed)
Spoke with pt. Pt wants to try a small prenatal vit instead of [place]. She has [place] and [employer]. Can you order a prenatal vit? Thanks!!! [employer]

## 2014-10-20 ENCOUNTER — Telehealth: Payer: Self-pay | Admitting: *Deleted

## 2014-10-20 NOTE — Telephone Encounter (Signed)
Pt c/o head cold and chest cold, no fever. Pt informed can take Robitussin Mucus and Chest congestion, saline nasal spray or flonase, cepacol throat lozenges if no improvement call office back or for fever. Pt verbalized understanding.

## 2014-10-27 ENCOUNTER — Ambulatory Visit (INDEPENDENT_AMBULATORY_CARE_PROVIDER_SITE_OTHER): Payer: PRIVATE HEALTH INSURANCE

## 2014-10-27 ENCOUNTER — Ambulatory Visit (INDEPENDENT_AMBULATORY_CARE_PROVIDER_SITE_OTHER): Payer: PRIVATE HEALTH INSURANCE | Admitting: Women's Health

## 2014-10-27 ENCOUNTER — Encounter: Payer: Self-pay | Admitting: Women's Health

## 2014-10-27 VITALS — BP 116/80 | Wt 223.0 lb

## 2014-10-27 DIAGNOSIS — O468X2 Other antepartum hemorrhage, second trimester: Secondary | ICD-10-CM

## 2014-10-27 DIAGNOSIS — Z363 Encounter for antenatal screening for malformations: Secondary | ICD-10-CM

## 2014-10-27 DIAGNOSIS — O418X20 Other specified disorders of amniotic fluid and membranes, second trimester, not applicable or unspecified: Secondary | ICD-10-CM

## 2014-10-27 DIAGNOSIS — Z36 Encounter for antenatal screening of mother: Secondary | ICD-10-CM

## 2014-10-27 DIAGNOSIS — Z3482 Encounter for supervision of other normal pregnancy, second trimester: Secondary | ICD-10-CM

## 2014-10-27 DIAGNOSIS — Z1389 Encounter for screening for other disorder: Secondary | ICD-10-CM

## 2014-10-27 DIAGNOSIS — Z331 Pregnant state, incidental: Secondary | ICD-10-CM

## 2014-10-27 LAB — POCT URINALYSIS DIPSTICK
Glucose, UA: NEGATIVE
KETONES UA: NEGATIVE
LEUKOCYTES UA: NEGATIVE
NITRITE UA: NEGATIVE
RBC UA: NEGATIVE

## 2014-10-27 NOTE — Progress Notes (Signed)
U/S 20+6wks- active fetus, meas c/w dates, fluid wnl, posterior Gr 0 placenta, cx appears closed (3.2cm), bilateral adnexa appears WNL, FHR-150 bpm, female fetus, no major abnl noted

## 2014-10-27 NOTE — Patient Instructions (Signed)
[place] Pediatricians:  [facility] [phone]            [facility] [phone]                 [facility] [phone] (usually doesn't accept new patients unless you have family there already, you are always welcome to call and ask)             [facility] ([address] [place]) [phone]   [place] Pediatricians:   [facility]: [phone]  [facility]: [phone]   Second Trimester of Pregnancy The second trimester is from week 13 through week 28, months 4 through 6. The second trimester is often a time when you feel your best. Your body has also adjusted to being pregnant, and you begin to feel better physically. Usually, morning sickness has lessened or quit completely, you may have more energy, and you may have an increase in appetite. The second trimester is also a time when the fetus is growing rapidly. At the end of the sixth month, the fetus is about 9 inches long and weighs about 1 pounds. You will likely begin to feel the baby move (quickening) between 18 and 20 weeks of the pregnancy. BODY CHANGES Your body goes through many changes during pregnancy. The changes vary from woman to woman.   Your weight will continue to increase. You will notice your lower abdomen bulging out.  You may begin to get stretch marks on your hips, abdomen, and breasts.  You may develop headaches that can be relieved by medicines approved by your health care provider.  You may urinate more often because the fetus is pressing on your bladder.  You may develop or continue to have heartburn as a result of your pregnancy.  You may develop constipation because certain hormones are causing the muscles that push waste through your intestines to slow down.  You may develop hemorrhoids or swollen, bulging veins (varicose veins).  You may have back pain because of the weight gain and  pregnancy hormones relaxing your joints between the bones in your pelvis and as a result of a shift in weight and the muscles that support your balance.  Your breasts will continue to grow and be tender.  Your gums may bleed and may be sensitive to brushing and flossing.  Dark spots or blotches (chloasma, mask of pregnancy) may develop on your face. This will likely fade after the baby is born.  A dark line from your belly button to the pubic area (linea nigra) may appear. This will likely fade after the baby is born.  You may have changes in your hair. These can include thickening of your hair, rapid growth, and changes in texture. Some women also have hair loss during or after pregnancy, or hair that feels dry or thin. Your hair will most likely return to normal after your baby is born. WHAT TO EXPECT AT YOUR PRENATAL VISITS During a routine prenatal visit:  You will be weighed to make sure you and the fetus are growing normally.  Your blood pressure will be taken.  Your abdomen will be measured to track your baby's growth.  The fetal heartbeat will be listened to.  Any test results from the previous visit will be discussed. Your health care provider may ask you:  How you are feeling.  If you are feeling the baby move.  If you have had any abnormal symptoms, such as leaking fluid,   bleeding, severe headaches, or abdominal cramping.  If you have any questions. Other tests that may be performed during your second trimester include:  Blood tests that check for:  Low iron levels (anemia).  Gestational diabetes (between 24 and 28 weeks).  Rh antibodies.  Urine tests to check for infections, diabetes, or protein in the urine.  An ultrasound to confirm the proper growth and development of the baby.  An amniocentesis to check for possible genetic problems.  Fetal screens for spina bifida and Down syndrome. HOME CARE INSTRUCTIONS   Avoid all smoking, herbs, alcohol, and  unprescribed drugs. These chemicals affect the formation and growth of the baby.  Follow your health care provider's instructions regarding medicine use. There are medicines that are either safe or unsafe to take during pregnancy.  Exercise only as directed by your health care provider. Experiencing uterine cramps is a good sign to stop exercising.  Continue to eat regular, healthy meals.  Wear a good support bra for breast tenderness.  Do not use hot tubs, steam rooms, or saunas.  Wear your seat belt at all times when driving.  Avoid raw meat, uncooked cheese, cat litter boxes, and soil used by cats. These carry germs that can cause birth defects in the baby.  Take your prenatal vitamins.  Try taking a stool softener (if your health care provider approves) if you develop constipation. Eat more high-fiber foods, such as fresh vegetables or fruit and whole grains. Drink plenty of fluids to keep your urine clear or pale yellow.  Take warm sitz baths to soothe any pain or discomfort caused by hemorrhoids. Use hemorrhoid cream if your health care provider approves.  If you develop varicose veins, wear support hose. Elevate your feet for 15 minutes, 3-4 times a day. Limit salt in your diet.  Avoid heavy lifting, wear low heel shoes, and practice good posture.  Rest with your legs elevated if you have leg cramps or low back pain.  Visit your dentist if you have not gone yet during your pregnancy. Use a soft toothbrush to brush your teeth and be gentle when you floss.  A sexual relationship may be continued unless your health care provider directs you otherwise.  Continue to go to all your prenatal visits as directed by your health care provider. SEEK MEDICAL CARE IF:   You have dizziness.  You have mild pelvic cramps, pelvic pressure, or nagging pain in the abdominal area.  You have persistent nausea, vomiting, or diarrhea.  You have a bad smelling vaginal discharge.  You have  pain with urination. SEEK IMMEDIATE MEDICAL CARE IF:   You have a fever.  You are leaking fluid from your vagina.  You have spotting or bleeding from your vagina.  You have severe abdominal cramping or pain.  You have rapid weight gain or loss.  You have shortness of breath with chest pain.  You notice sudden or extreme swelling of your face, hands, ankles, feet, or legs.  You have not felt your baby move in over an hour.  You have severe headaches that do not go away with medicine.  You have vision changes. Document Released: 12/06/2001 Document Revised: 12/17/2013 Document Reviewed: 02/12/2013 [employer] Patient Information 2015 [employer]. This information is not intended to replace advice given to you by your health care provider. Make sure you discuss any questions you have with your health care provider.  

## 2014-10-27 NOTE — Progress Notes (Signed)
Low-risk OB appointment G2P0010 5768w6d Estimated Date of Delivery: 03/10/15 BP 116/80 mmHg  Wt 223 lb (101.152 kg)  LMP 05/27/2014  BP, weight, and urine reviewed.  Refer to obstetrical flow sheet for FH & FHR.  Reports good fm.  Denies regular uc's, lof, vb, or uti s/s. Cold x 1 week, feels like she's getting better.  Reviewed today's normal anatomy u/s. Discussed ptl s/s, fm.  Plan:  Continue routine obstetrical care  F/U in 4wks for OB appointment  Had flu shot at work in Oct

## 2014-11-24 ENCOUNTER — Encounter: Payer: PRIVATE HEALTH INSURANCE | Admitting: Women's Health

## 2014-11-26 ENCOUNTER — Ambulatory Visit (INDEPENDENT_AMBULATORY_CARE_PROVIDER_SITE_OTHER): Payer: PRIVATE HEALTH INSURANCE | Admitting: Women's Health

## 2014-11-26 ENCOUNTER — Encounter: Payer: Self-pay | Admitting: Women's Health

## 2014-11-26 VITALS — BP 100/60 | Wt 228.0 lb

## 2014-11-26 DIAGNOSIS — Z1389 Encounter for screening for other disorder: Secondary | ICD-10-CM

## 2014-11-26 DIAGNOSIS — Z3492 Encounter for supervision of normal pregnancy, unspecified, second trimester: Secondary | ICD-10-CM

## 2014-11-26 DIAGNOSIS — B372 Candidiasis of skin and nail: Secondary | ICD-10-CM

## 2014-11-26 DIAGNOSIS — Z331 Pregnant state, incidental: Secondary | ICD-10-CM

## 2014-11-26 LAB — POCT URINALYSIS DIPSTICK
Blood, UA: NEGATIVE
GLUCOSE UA: NEGATIVE
Leukocytes, UA: NEGATIVE
NITRITE UA: NEGATIVE

## 2014-11-26 NOTE — Progress Notes (Signed)
Low-risk OB appointment G2P0010 [id number] Estimated Date of Delivery: 03/10/15 BP 100/60 mmHg  Wt 228 lb (103.42 kg)  LMP 05/27/2014  BP, weight, and urine reviewed.  Refer to obstetrical flow sheet for FH & FHR.  Reports good fm.  Denies regular uc's, lof, vb, or uti s/s. Itchy rash Lt inner crease thigh and vulvar itching  Erythematous area Lt inner thigh/crease & vulva c/w yeast- painted w/ gentian violet- to let us now if not helping and will rx diflucan Reviewed ptl s/s, fkc. Plan:  Continue routine obstetrical care  F/U in 3wks for OB appointment and pn2

## 2014-11-26 NOTE — Patient Instructions (Addendum)
You will have your sugar test next visit.  Please do not eat or drink anything after midnight the night before you come, not even water.  You will be here for at least two hours.     For your lower back pain you may:  Purchase a pregnancy belt from [employer], [employer], [employer], etc and wear it while you are up and about  Take warm baths  Use a heating pad to your lower back for no longer than 20 minutes at a time, and do not place near abdomen  Take tylenol as needed. Please follow directions on the bottle   Call the office [phone]) or go to [facility] if:  You begin to have strong, frequent contractions  Your water breaks.  Sometimes it is a big gush of fluid, sometimes it is just a trickle that keeps getting your panties wet or running down your legs  You have vaginal bleeding.  It is normal to have a small amount of spotting if your cervix was checked.   You don't feel your baby moving like normal.  If you don't, get you something to eat and drink and lay down and focus on feeling your baby move.  If your baby is still not moving like normal, you should call the office or go to [facility].    Second Trimester of Pregnancy The second trimester is from week 13 through week 28, months 4 through 6. The second trimester is often a time when you feel your best. Your body has also adjusted to being pregnant, and you begin to feel better physically. Usually, morning sickness has lessened or quit completely, you may have more energy, and you may have an increase in appetite. The second trimester is also a time when the fetus is growing rapidly. At the end of the sixth month, the fetus is about 9 inches long and weighs about 1 pounds. You will likely begin to feel the baby move (quickening) between 18 and 20 weeks of the pregnancy. BODY CHANGES Your body goes through many changes during pregnancy. The changes vary from woman to woman.  5. Your weight will continue to  increase. You will notice your lower abdomen bulging out. 6. You may begin to get stretch marks on your hips, abdomen, and breasts. 7. You may develop headaches that can be relieved by medicines approved by your health care provider. 8. You may urinate more often because the fetus is pressing on your bladder. 9. You may develop or continue to have heartburn as a result of your pregnancy. 10. You may develop constipation because certain hormones are causing the muscles that push waste through your intestines to slow down. 11. You may develop hemorrhoids or swollen, bulging veins (varicose veins). 12. You may have back pain because of the weight gain and pregnancy hormones relaxing your joints between the bones in your pelvis and as a result of a shift in weight and the muscles that support your balance. 13. Your breasts will continue to grow and be tender. 14. Your gums may bleed and may be sensitive to brushing and flossing. 15. Dark spots or blotches (chloasma, mask of pregnancy) may develop on your face. This will likely fade after the baby is born. 16. A dark line from your belly button to the pubic area (linea nigra) may appear. This will likely fade after the baby is born. 17. You may have changes in your hair. These can include thickening of your hair,  rapid growth, and changes in texture. Some women also have hair loss during or after pregnancy, or hair that feels dry or thin. Your hair will most likely return to normal after your baby is born. WHAT TO EXPECT AT YOUR PRENATAL VISITS During a routine prenatal visit:  You will be weighed to make sure you and the fetus are growing normally.  Your blood pressure will be taken.  Your abdomen will be measured to track your baby's growth.  The fetal heartbeat will be listened to.  Any test results from the previous visit will be discussed. Your health care provider may ask you:  How you are feeling.  If you are feeling the baby  move.  If you have had any abnormal symptoms, such as leaking fluid, bleeding, severe headaches, or abdominal cramping.  If you have any questions. Other tests that may be performed during your second trimester include:  Blood tests that check for:  Low iron levels (anemia).  Gestational diabetes (between 24 and 28 weeks).  Rh antibodies.  Urine tests to check for infections, diabetes, or protein in the urine.  An ultrasound to confirm the proper growth and development of the baby.  An amniocentesis to check for possible genetic problems.  Fetal screens for spina bifida and Down syndrome. HOME CARE INSTRUCTIONS  5. Avoid all smoking, herbs, alcohol, and unprescribed drugs. These chemicals affect the formation and growth of the baby. 6. Follow your health care provider's instructions regarding medicine use. There are medicines that are either safe or unsafe to take during pregnancy. 7. Exercise only as directed by your health care provider. Experiencing uterine cramps is a good sign to stop exercising. 8. Continue to eat regular, healthy meals. 9. Wear a good support bra for breast tenderness. 10. Do not use hot tubs, steam rooms, or saunas. 11. Wear your seat belt at all times when driving. 12. Avoid raw meat, uncooked cheese, cat litter boxes, and soil used by cats. These carry germs that can cause birth defects in the baby. 13. Take your prenatal vitamins. 14. Try taking a stool softener (if your health care provider approves) if you develop constipation. Eat more high-fiber foods, such as fresh vegetables or fruit and whole grains. Drink plenty of fluids to keep your urine clear or pale yellow. 15. Take warm sitz baths to soothe any pain or discomfort caused by hemorrhoids. Use hemorrhoid cream if your health care provider approves. 16. If you develop varicose veins, wear support hose. Elevate your feet for 15 minutes, 3-4 times a day. Limit salt in your diet. 17. Avoid heavy  lifting, wear low heel shoes, and practice good posture. 18. Rest with your legs elevated if you have leg cramps or low back pain. 19. Visit your dentist if you have not gone yet during your pregnancy. Use a soft toothbrush to brush your teeth and be gentle when you floss. 20. A sexual relationship may be continued unless your health care provider directs you otherwise. 21. Continue to go to all your prenatal visits as directed by your health care provider. SEEK MEDICAL CARE IF:   You have dizziness.  You have mild pelvic cramps, pelvic pressure, or nagging pain in the abdominal area.  You have persistent nausea, vomiting, or diarrhea.  You have a bad smelling vaginal discharge.  You have pain with urination. SEEK IMMEDIATE MEDICAL CARE IF:   You have a fever.  You are leaking fluid from your vagina.  You have spotting or bleeding from  your vagina.  You have severe abdominal cramping or pain.  You have rapid weight gain or loss.  You have shortness of breath with chest pain.  You notice sudden or extreme swelling of your face, hands, ankles, feet, or legs.  You have not felt your baby move in over an hour.  You have severe headaches that do not go away with medicine.  You have vision changes. Document Released: 12/06/2001 Document Revised: 12/17/2013 Document Reviewed: 02/12/2013 [facility] Patient Information 2015 [place], [place]. This information is not intended to replace advice given to you by your health care provider. Make sure you discuss any questions you have with your health care provider.

## 2014-12-17 ENCOUNTER — Ambulatory Visit (INDEPENDENT_AMBULATORY_CARE_PROVIDER_SITE_OTHER): Payer: PRIVATE HEALTH INSURANCE | Admitting: Advanced Practice Midwife

## 2014-12-17 ENCOUNTER — Other Ambulatory Visit: Payer: PRIVATE HEALTH INSURANCE

## 2014-12-17 VITALS — BP 96/72 | Wt 228.0 lb

## 2014-12-17 DIAGNOSIS — Z113 Encounter for screening for infections with a predominantly sexual mode of transmission: Secondary | ICD-10-CM

## 2014-12-17 DIAGNOSIS — Z0184 Encounter for antibody response examination: Secondary | ICD-10-CM

## 2014-12-17 DIAGNOSIS — Z3483 Encounter for supervision of other normal pregnancy, third trimester: Secondary | ICD-10-CM

## 2014-12-17 DIAGNOSIS — Z331 Pregnant state, incidental: Secondary | ICD-10-CM

## 2014-12-17 DIAGNOSIS — Z131 Encounter for screening for diabetes mellitus: Secondary | ICD-10-CM

## 2014-12-17 DIAGNOSIS — B372 Candidiasis of skin and nail: Secondary | ICD-10-CM

## 2014-12-17 DIAGNOSIS — Z1389 Encounter for screening for other disorder: Secondary | ICD-10-CM

## 2014-12-17 DIAGNOSIS — Z114 Encounter for screening for human immunodeficiency virus [HIV]: Secondary | ICD-10-CM

## 2014-12-17 LAB — CBC
HCT: 35.9 % — ABNORMAL LOW (ref 36.0–46.0)
Hemoglobin: 12.2 g/dL (ref 12.0–15.0)
MCH: 31.9 pg (ref 26.0–34.0)
MCHC: 34 g/dL (ref 30.0–36.0)
MCV: 94 fL (ref 78.0–100.0)
MPV: 11.7 fL (ref 9.4–12.4)
Platelets: 198 10*3/uL (ref 150–400)
RBC: 3.82 MIL/uL — AB (ref 3.87–5.11)
RDW: 13 % (ref 11.5–15.5)
WBC: 8.3 10*3/uL (ref 4.0–10.5)

## 2014-12-17 LAB — POCT URINALYSIS DIPSTICK
GLUCOSE UA: NEGATIVE
Ketones, UA: NEGATIVE
LEUKOCYTES UA: NEGATIVE
NITRITE UA: NEGATIVE
PROTEIN UA: NEGATIVE

## 2014-12-17 MED ORDER — FLUCONAZOLE 150 MG PO TABS: ORAL_TABLET | ORAL | Status: DC

## 2014-12-17 NOTE — Progress Notes (Signed)
G2P0010 6977w1d Estimated Date of Delivery: 03/10/15  Blood pressure 96/72, weight 228 lb (103.42 kg), last menstrual period 05/27/2014.   BP weight and urine results all reviewed and noted.  Please refer to the obstetrical flow sheet for the fundal height and fetal heart rate documentation:  Patient reports good fetal movement, denies any bleeding and no rupture of membranes symptoms or regular contractions. Patient c/o vaginal itching in groin.,  Was painted with gentian violet a few weeks ago; that helped, but itching came back  SSE:  Small amount of d/c c/w yeast . All questions were answered.  Plan:  Diflucan 150mg  X2; may use OTC cream on groin. Continued routine obstetrical care, PN2 today  Follow up in 3 weeks for OB appointment,

## 2014-12-18 LAB — GLUCOSE TOLERANCE, 2 HOURS W/ 1HR
GLUCOSE, FASTING: 77 mg/dL (ref 70–99)
Glucose, 1 hour: 99 mg/dL (ref 70–170)
Glucose, 2 hour: 82 mg/dL (ref 70–139)

## 2014-12-18 LAB — ANTIBODY SCREEN: Antibody Screen: NEGATIVE

## 2014-12-18 LAB — HSV 2 ANTIBODY, IGG

## 2014-12-18 LAB — RPR

## 2014-12-18 LAB — HIV ANTIBODY (ROUTINE TESTING W REFLEX): HIV: NONREACTIVE

## 2014-12-23 ENCOUNTER — Encounter: Payer: Self-pay | Admitting: *Deleted

## 2014-12-26 NOTE — L&D Delivery Note (Signed)
Patient is 29 y.o. G2P0010 6861w6d admitted with [facility]OM   Delivery Note At 11:38 PM a healthy female was delivered via  (Presentation: cephalic ; occiput anterior ).  APGAR: 8, 9; weight pending.   Placenta status: spontaneous, intact .  Cord:  3 vessel cord with the following complications: none.  Loose nuchal cord noted at delivery.   Anesthesia: Epidural  Episiotomy:  None Lacerations:  1st Degree Suture Repair: 3.0 vicryl Est. Blood Loss (mL):  300  Mom to postpartum.  Baby to Couplet care / Skin to Skin.  [name], [place] N 03/16/2015, 12:02 AM

## 2015-01-07 ENCOUNTER — Encounter: Payer: Self-pay | Admitting: Advanced Practice Midwife

## 2015-01-07 ENCOUNTER — Encounter: Payer: PRIVATE HEALTH INSURANCE | Admitting: Advanced Practice Midwife

## 2015-01-07 ENCOUNTER — Ambulatory Visit (INDEPENDENT_AMBULATORY_CARE_PROVIDER_SITE_OTHER): Payer: PRIVATE HEALTH INSURANCE | Admitting: Advanced Practice Midwife

## 2015-01-07 VITALS — BP 100/70 | Temp 98.3°F | Wt 229.4 lb

## 2015-01-07 DIAGNOSIS — Z3483 Encounter for supervision of other normal pregnancy, third trimester: Secondary | ICD-10-CM

## 2015-01-07 DIAGNOSIS — Z331 Pregnant state, incidental: Secondary | ICD-10-CM

## 2015-01-07 DIAGNOSIS — Z1389 Encounter for screening for other disorder: Secondary | ICD-10-CM

## 2015-01-07 LAB — POCT URINALYSIS DIPSTICK
Blood, UA: NEGATIVE
GLUCOSE UA: NEGATIVE
Ketones, UA: NEGATIVE
LEUKOCYTES UA: NEGATIVE
Nitrite, UA: NEGATIVE
PROTEIN UA: NEGATIVE

## 2015-01-07 NOTE — Patient Instructions (Signed)
Lyletta IUD  Levonorgestrel intrauterine device (IUD) What is this medicine? LEVONORGESTREL IUD (LEE voe nor jes trel) is a contraceptive (birth control) device. The device is placed inside the uterus by a healthcare professional. It is used to prevent pregnancy and can also be used to treat heavy bleeding that occurs during your period. Depending on the device, it can be used for 3 to 5 years. This medicine may be used for other purposes; ask your health care provider or pharmacist if you have questions. COMMON BRAND NAME(S): Elveria RoyalsLILETTA, Mirena, Skyla What should I tell my health care provider before I take this medicine? They need to know if you have any of these conditions: -abnormal Pap smear -cancer of the breast, uterus, or cervix -diabetes -endometritis -genital or pelvic infection now or in the past -have more than one sexual partner or your partner has more than one partner -heart disease -history of an ectopic or tubal pregnancy -immune system problems -IUD in place -liver disease or tumor -problems with blood clots or take blood-thinners -use intravenous drugs -uterus of unusual shape -vaginal bleeding that has not been explained -an unusual or allergic reaction to levonorgestrel, other hormones, silicone, or polyethylene, medicines, foods, dyes, or preservatives -pregnant or trying to get pregnant -breast-feeding How should I use this medicine? This device is placed inside the uterus by a health care professional. Talk to your pediatrician regarding the use of this medicine in children. Special care may be needed. Overdosage: If you think you have taken too much of this medicine contact a poison control center or emergency room at once. NOTE: This medicine is only for you. Do not share this medicine with others. What if I miss a dose? This does not apply. What may interact with this medicine? Do not take this medicine with any of the following  medications: -amprenavir -bosentan -fosamprenavir This medicine may also interact with the following medications: -aprepitant -barbiturate medicines for inducing sleep or treating seizures -bexarotene -griseofulvin -medicines to treat seizures like carbamazepine, ethotoin, felbamate, oxcarbazepine, phenytoin, topiramate -modafinil -pioglitazone -rifabutin -rifampin -rifapentine -some medicines to treat HIV infection like atazanavir, indinavir, lopinavir, nelfinavir, tipranavir, ritonavir -St. John's wort -warfarin This list may not describe all possible interactions. Give your health care provider a list of all the medicines, herbs, non-prescription drugs, or dietary supplements you use. Also tell them if you smoke, drink alcohol, or use illegal drugs. Some items may interact with your medicine. What should I watch for while using this medicine? Visit your doctor or health care professional for regular check ups. See your doctor if you or your partner has sexual contact with others, becomes HIV positive, or gets a sexual transmitted disease. This product does not protect you against HIV infection (AIDS) or other sexually transmitted diseases. You can check the placement of the IUD yourself by reaching up to the top of your vagina with clean fingers to feel the threads. Do not pull on the threads. It is a good habit to check placement after each menstrual period. Call your doctor right away if you feel more of the IUD than just the threads or if you cannot feel the threads at all. The IUD may come out by itself. You may become pregnant if the device comes out. If you notice that the IUD has come out use a backup birth control method like condoms and call your health care provider. Using tampons will not change the position of the IUD and are okay to use during your period. What  side effects may I notice from receiving this medicine? Side effects that you should report to your doctor or  health care professional as soon as possible: -allergic reactions like skin rash, itching or hives, swelling of the face, lips, or tongue -fever, flu-like symptoms -genital sores -high blood pressure -no menstrual period for 6 weeks during use -pain, swelling, warmth in the leg -pelvic pain or tenderness -severe or sudden headache -signs of pregnancy -stomach cramping -sudden shortness of breath -trouble with balance, talking, or walking -unusual vaginal bleeding, discharge -yellowing of the eyes or skin Side effects that usually do not require medical attention (report to your doctor or health care professional if they continue or are bothersome): -acne -breast pain -change in sex drive or performance -changes in weight -cramping, dizziness, or faintness while the device is being inserted -headache -irregular menstrual bleeding within first 3 to 6 months of use -nausea This list may not describe all possible side effects. Call your doctor for medical advice about side effects. You may report side effects to FDA at 1-800-FDA-1088. Where should I keep my medicine? This does not apply. NOTE: This sheet is a summary. It may not cover all possible information. If you have questions about this medicine, talk to your doctor, pharmacist, or health care provider.  2015, [employer] Standard. (2012-01-12 13:54:04)

## 2015-01-07 NOTE — Progress Notes (Signed)
G2P0010 2537w1d Estimated Date of Delivery: 03/10/15  Blood pressure 100/70, temperature 98.3 F (36.8 C), weight 229 lb 6.4 oz (104.055 kg), last menstrual period 05/27/2014.   BP weight and urine results all reviewed and noted.  Please refer to the obstetrical flow sheet for the fundal height and fetal heart rate documentation:  Patient reports good fetal movement, denies any bleeding and no rupture of membranes symptoms or regular contractions. Patient c/o head/sinus congestion for 3 days. All questions were answered.  Plan:  Continued routine obstetrical care, OTC meds.  Discussed when antibiotics are appropriate (pt may call next week)  Follow up in 2 weeks for OB appointment,

## 2015-01-28 ENCOUNTER — Ambulatory Visit (INDEPENDENT_AMBULATORY_CARE_PROVIDER_SITE_OTHER): Payer: PRIVATE HEALTH INSURANCE | Admitting: Advanced Practice Midwife

## 2015-01-28 VITALS — BP 90/64 | Wt 236.0 lb

## 2015-01-28 DIAGNOSIS — Z3483 Encounter for supervision of other normal pregnancy, third trimester: Secondary | ICD-10-CM

## 2015-01-28 DIAGNOSIS — Z331 Pregnant state, incidental: Secondary | ICD-10-CM

## 2015-01-28 DIAGNOSIS — Z1389 Encounter for screening for other disorder: Secondary | ICD-10-CM

## 2015-01-28 LAB — POCT URINALYSIS DIPSTICK
Blood, UA: NEGATIVE
Glucose, UA: NEGATIVE
Ketones, UA: NEGATIVE
LEUKOCYTES UA: NEGATIVE
Nitrite, UA: NEGATIVE

## 2015-01-28 NOTE — Progress Notes (Signed)
G2P0010 4038w1d Estimated Date of Delivery: 03/10/15  Blood pressure 90/64, weight 236 lb (107.049 kg), last menstrual period 05/27/2014.   BP weight and urine results all reviewed and noted.  Please refer to the obstetrical flow sheet for the fundal height and fetal heart rate documentation:  Patient reports good fetal movement, denies any bleeding and no rupture of membranes symptoms or regular contractions. Patient is without complaints. All questions were answered.  Plan:  Continued routine obstetrical care,   Follow up in 2 weeks for OB appointment,

## 2015-01-28 NOTE — Patient Instructions (Signed)

## 2015-02-04 ENCOUNTER — Telehealth: Payer: Self-pay | Admitting: *Deleted

## 2015-02-04 NOTE — Telephone Encounter (Signed)
Pt states that she is having congestion and sinus drainage in back of throat.  Advised her that she can use Claritin, Zyrtec or Mucinex for the congestion or can use Benadryl at night, also try saline nasal spray and humidifier.  Use Tylenol if throat is irritated from the drainage, push fluids and rest when possible.  Pt reports good FM and denies any vaginal bleeding or cramping.  She states she is having some swelling in her hands and feet and her hands get numb, advised her to elevate feet when possible and can get wrist braces and wear at night to see if that helps with the numbness in hands.  Advised pt to call us back if develops fever, cramping or bleeding or if symptoms get worse otherwise keep appointment for next week.  Pt verbalized understanding.

## 2015-02-11 ENCOUNTER — Ambulatory Visit (INDEPENDENT_AMBULATORY_CARE_PROVIDER_SITE_OTHER): Payer: PRIVATE HEALTH INSURANCE | Admitting: Women's Health

## 2015-02-11 ENCOUNTER — Encounter: Payer: Self-pay | Admitting: Women's Health

## 2015-02-11 VITALS — BP 98/66 | Wt 240.0 lb

## 2015-02-11 DIAGNOSIS — O26899 Other specified pregnancy related conditions, unspecified trimester: Secondary | ICD-10-CM

## 2015-02-11 DIAGNOSIS — Z1389 Encounter for screening for other disorder: Secondary | ICD-10-CM

## 2015-02-11 DIAGNOSIS — G5600 Carpal tunnel syndrome, unspecified upper limb: Secondary | ICD-10-CM

## 2015-02-11 DIAGNOSIS — Z331 Pregnant state, incidental: Secondary | ICD-10-CM

## 2015-02-11 DIAGNOSIS — Z3483 Encounter for supervision of other normal pregnancy, third trimester: Secondary | ICD-10-CM

## 2015-02-11 LAB — POCT URINALYSIS DIPSTICK
Blood, UA: NEGATIVE
Glucose, UA: NEGATIVE
Ketones, UA: NEGATIVE
LEUKOCYTES UA: NEGATIVE
Nitrite, UA: NEGATIVE

## 2015-02-11 NOTE — Patient Instructions (Signed)
Wrist splints Elevate legs as much as possible  Call the office [phone]) or [facility] if:  You begin to have strong, frequent contractions  Your water breaks.  Sometimes it is a big gush of fluid, sometimes it is just a trickle that keeps getting your panties wet or running down your legs  You have vaginal bleeding.  It is normal to have a small amount of spotting if your cervix was checked.   You don't feel your baby moving like normal.  If you don't, get you something to eat and drink and lay down and focus on feeling your baby move.  You should feel at least 10 movements in 2 hours.  If you don't, you should call the office or [facility].    Preterm Labor Information Preterm labor is when labor starts at less than 37 weeks of pregnancy. The normal length of a pregnancy is 39 to 41 weeks. CAUSES Often, there is no identifiable underlying cause as to why a woman goes into preterm labor. One of the most common known causes of preterm labor is infection. Infections of the uterus, cervix, vagina, amniotic sac, bladder, kidney, or even the lungs (pneumonia) can cause labor to start. Other suspected causes of preterm labor include:   Urogenital infections, such as yeast infections and bacterial vaginosis.   Uterine abnormalities (uterine shape, uterine septum, fibroids, or bleeding from the placenta).   A cervix that has been operated on (it may fail to stay closed).   Malformations in the fetus.   Multiple gestations (twins, triplets, and so on).   Breakage of the amniotic sac.  RISK FACTORS  Having a previous history of preterm labor.   Having premature rupture of membranes (PROM).   Having a placenta that covers the opening of the cervix (placenta previa).   Having a placenta that separates from the uterus (placental abruption).   Having a cervix that is too weak to hold the fetus in the uterus (incompetent cervix).   Having too much fluid  in the amniotic sac (polyhydramnios).   Taking illegal drugs or smoking while pregnant.   Not gaining enough weight while pregnant.   Being younger than 2618 and older than 29 years old.   Having a low socioeconomic status.   Being African American. SYMPTOMS Signs and symptoms of preterm labor include:   Menstrual-like cramps, abdominal pain, or back pain.  Uterine contractions that are regular, as frequent as six in an hour, regardless of their intensity (may be mild or painful).  Contractions that start on the top of the uterus and spread down to the lower abdomen and back.   A sense of increased pelvic pressure.   A watery or bloody mucus discharge that comes from the vagina.  TREATMENT Depending on the length of the pregnancy and other circumstances, your health care provider may suggest bed rest. If necessary, there are medicines that can be given to stop contractions and to mature the fetal lungs. If labor happens before 34 weeks of pregnancy, a prolonged hospital stay may be recommended. Treatment depends on the condition of both you and the fetus.  WHAT SHOULD YOU DO IF YOU THINK YOU ARE IN PRETERM LABOR? Call your health care provider right away. You will need to go to the hospital to get checked immediately. HOW CAN YOU PREVENT PRETERM LABOR IN FUTURE PREGNANCIES? You should:   Stop smoking if you smoke.  Maintain healthy weight gain and avoid chemicals and  drugs that are not necessary.  Be watchful for any type of infection.  Inform your health care provider if you have a known history of preterm labor. Document Released: 03/03/2004 Document Revised: 08/14/2013 Document Reviewed: 01/14/2013 [facility] Patient Information 2015 [employer], [place]. This information is not intended to replace advice given to you by your health care provider. Make sure you discuss any questions you have with your health care provider.

## 2015-02-11 NOTE — Progress Notes (Signed)
Low-risk OB appointment G2P0010 7165w1d Estimated Date of Delivery: 03/10/15 BP 98/66 mmHg  Wt 240 lb (108.863 kg)  LMP 05/27/2014  BP, weight, and urine reviewed.  Refer to obstetrical flow sheet for FH & FHR.  Reports good fm.  Denies regular uc's, lof, vb, or uti s/s. Bilateral hand/wrist tingling/numbness at work and at night. 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 Recommended wrist splints day & night, take off occ.  Legs swelling, recommended elevating as much as possible.  Vtx by leopold's Reviewed ptl s/s, fkc. Plan:  Continue routine obstetrical care  F/U in 1wk for OB appointment and gbs

## 2015-02-18 ENCOUNTER — Encounter: Payer: Self-pay | Admitting: Advanced Practice Midwife

## 2015-02-18 ENCOUNTER — Ambulatory Visit (INDEPENDENT_AMBULATORY_CARE_PROVIDER_SITE_OTHER): Payer: PRIVATE HEALTH INSURANCE | Admitting: Advanced Practice Midwife

## 2015-02-18 VITALS — BP 104/60 | HR 88 | Wt 241.0 lb

## 2015-02-18 DIAGNOSIS — Z3685 Encounter for antenatal screening for Streptococcus B: Secondary | ICD-10-CM

## 2015-02-18 DIAGNOSIS — Z118 Encounter for screening for other infectious and parasitic diseases: Secondary | ICD-10-CM

## 2015-02-18 DIAGNOSIS — Z3483 Encounter for supervision of other normal pregnancy, third trimester: Secondary | ICD-10-CM

## 2015-02-18 DIAGNOSIS — Z1159 Encounter for screening for other viral diseases: Secondary | ICD-10-CM

## 2015-02-18 DIAGNOSIS — Z331 Pregnant state, incidental: Secondary | ICD-10-CM

## 2015-02-18 DIAGNOSIS — Z1389 Encounter for screening for other disorder: Secondary | ICD-10-CM

## 2015-02-18 LAB — POCT URINALYSIS DIPSTICK
Blood, UA: NEGATIVE
GLUCOSE UA: NEGATIVE
LEUKOCYTES UA: NEGATIVE
Nitrite, UA: NEGATIVE

## 2015-02-18 LAB — OB RESULTS CONSOLE GBS: GBS: POSITIVE

## 2015-02-18 NOTE — Progress Notes (Signed)
G2P0010 5675w1d Estimated Date of Delivery: 03/10/15  Blood pressure 104/60, pulse 88, weight 241 lb (109.317 kg), last menstrual period 05/27/2014.   BP weight and urine results all reviewed and noted.  Please refer to the obstetrical flow sheet for the fundal height and fetal heart rate documentation:  Patient reports good fetal movement, denies any and no rupture of membranes symptoms or regular contractions. Patient c/o hand swelling/tingling/pain: Carpel tunnel.  Getting tired at work (8-5) All questions were answered.  Plan:  Continued routine obstetrical care, GBS today.  Splint wrists at night.  Note given to work 8-3, hopefully her job will accommodate  Follow up in 1 weeks for OB appointment,

## 2015-02-19 LAB — GC/CHLAMYDIA PROBE AMP
Chlamydia trachomatis, NAA: NEGATIVE
NEISSERIA GONORRHOEAE BY PCR: NEGATIVE

## 2015-02-21 LAB — CULTURE, BETA STREP (GROUP B ONLY): Strep Gp B Culture: POSITIVE — AB

## 2015-02-26 ENCOUNTER — Ambulatory Visit (INDEPENDENT_AMBULATORY_CARE_PROVIDER_SITE_OTHER): Payer: PRIVATE HEALTH INSURANCE | Admitting: Advanced Practice Midwife

## 2015-02-26 VITALS — BP 102/70 | HR 80 | Wt 245.0 lb

## 2015-02-26 DIAGNOSIS — Z331 Pregnant state, incidental: Secondary | ICD-10-CM

## 2015-02-26 DIAGNOSIS — Z1389 Encounter for screening for other disorder: Secondary | ICD-10-CM

## 2015-02-26 DIAGNOSIS — Z3483 Encounter for supervision of other normal pregnancy, third trimester: Secondary | ICD-10-CM

## 2015-02-26 LAB — POCT URINALYSIS DIPSTICK
Glucose, UA: NEGATIVE
Ketones, UA: NEGATIVE
Leukocytes, UA: NEGATIVE
Nitrite, UA: NEGATIVE
RBC UA: NEGATIVE

## 2015-02-26 NOTE — Patient Instructions (Signed)
"  Membrane Stripping"   AM I IN LABOR? What is labor? Labor is the work that your body does to birth your baby. Your uterus (the womb) contracts. Your cervix (the mouth of the uterus) opens. You will push your baby out into the world.  What do contractions (labor pains) feel like? When they first start, contractions usually feel like cramps during your period. Sometimes you feel pain in your back. Most often, contractions feel like muscles pulling painfully in your lower belly. At first, the contractions will probably be 15 to 20 minutes apart. They will not feel too painful. As labor goes on, the contractions get stronger, closer together, and more painful.  How do I time the contractions? Time your contractions by counting the number of minutes from the start of one contraction to the start of the next contraction.  What should I do when the contractions start? If it is night and you can sleep, sleep. If it happens during the day, here are some things you can do to take care of yourself at home: ? Walk. If the pains you are having are real labor, walking will make the contractions come faster and harder. If the contractions are not going to continue and be real labor, walking will make the contractions slow down. ? Take a shower or bath. This will help you relax. ? Eat. Labor is a big event. It takes a lot of energy. ? Drink water. Not drinking enough water can cause false labor (contractions that hurt but do not open your cervix). If this is true labor, drinking water will help you have strength to get through your labor. ? Take a nap. Get all the rest you can. ? Get a massage. If your labor is in your back, a strong massage on your lower back may feel very good. Getting a foot massage is always good. ? Don't panic. You can do this. Your body was made for this. You are strong!  When should I go to the hospital or call my health care provider? ? Your contractions have been 5 minutes apart or  less for at least 1 hour. ? If several contractions are so painful you cannot walk or talk during one. ? Your bag of waters breaks. (You may have a big gush of water or just water that runs down your legs when you walk.)  Are there other reasons to call my health care provider? Yes, you should call your health care provider or go to the hospital if you start to bleed like you are having a period- blood that soaks your underwear or runs down your legs, if you have sudden severe pain, if your baby has not moved for several hours, or if you are leaking green fluid. The rule is as follows: If you are very concerned about something, call.

## 2015-02-26 NOTE — Progress Notes (Signed)
G2P0010 7854w2d Estimated Date of Delivery: 03/10/15  Blood pressure 102/70, pulse 80, weight 245 lb (111.131 kg), last menstrual period 05/27/2014.   BP weight and urine results all reviewed and noted.  Please refer to the obstetrical flow sheet for the fundal height and fetal heart rate documentation:  Patient reports good fetal movement, denies any bleeding and no rupture of membranes symptoms or regular contractions. Patient is without complaints. All questions were answered.  Plan:  Continued routine obstetrical care,   Follow up in 1 weeks for OB appointment,

## 2015-02-27 ENCOUNTER — Encounter (HOSPITAL_COMMUNITY): Payer: Self-pay | Admitting: *Deleted

## 2015-02-27 ENCOUNTER — Inpatient Hospital Stay (HOSPITAL_COMMUNITY)
Admission: AD | Admit: 2015-02-27 | Discharge: 2015-02-27 | Disposition: A | Discharge status: Home / Self Care | Payer: PRIVATE HEALTH INSURANCE | Source: Ambulatory Visit | Attending: Family Medicine | Admitting: Family Medicine

## 2015-02-27 DIAGNOSIS — Z3A38 38 weeks gestation of pregnancy: Secondary | ICD-10-CM | POA: Diagnosis not present

## 2015-02-27 DIAGNOSIS — Z0371 Encounter for suspected problem with amniotic cavity and membrane ruled out: Secondary | ICD-10-CM | POA: Diagnosis not present

## 2015-02-27 DIAGNOSIS — N393 Stress incontinence (female) (male): Secondary | ICD-10-CM | POA: Diagnosis not present

## 2015-02-27 DIAGNOSIS — O9989 Other specified diseases and conditions complicating pregnancy, childbirth and the puerperium: Secondary | ICD-10-CM | POA: Diagnosis not present

## 2015-02-27 DIAGNOSIS — Z3A37 37 weeks gestation of pregnancy: Secondary | ICD-10-CM | POA: Diagnosis not present

## 2015-02-27 LAB — POCT FERN TEST: POCT Fern Test: NEGATIVE

## 2015-02-27 NOTE — Discharge Instructions (Signed)
Braxton Hicks Contractions °Contractions of the uterus can occur throughout pregnancy. Contractions are not always a sign that you are in labor.  °WHAT ARE BRAXTON HICKS CONTRACTIONS?  °Contractions that occur before labor are called Braxton Hicks contractions, or false labor. Toward the end of pregnancy (32-34 weeks), these contractions can develop more often and may become more forceful. This is not true labor because these contractions do not result in opening (dilatation) and thinning of the cervix. They are sometimes difficult to tell apart from true labor because these contractions can be forceful and people have different pain tolerances. You should not feel embarrassed if you go to the hospital with false labor. Sometimes, the only way to tell if you are in true labor is for your health care provider to look for changes in the cervix. °If there are no prenatal problems or other health problems associated with the pregnancy, it is completely safe to be sent home with false labor and await the onset of true labor. °HOW CAN YOU TELL THE DIFFERENCE BETWEEN TRUE AND FALSE LABOR? °False Labor °· The contractions of false labor are usually shorter and not as hard as those of true labor.   °· The contractions are usually irregular.   °· The contractions are often felt in the front of the lower abdomen and in the groin.   °· The contractions may go away when you walk around or change positions while lying down.   °· The contractions get weaker and are shorter lasting as time goes on.   °· The contractions do not usually become progressively stronger, regular, and closer together as with true labor.   °True Labor °· Contractions in true labor last 30-70 seconds, become very regular, usually become more intense, and increase in frequency.   °· The contractions do not go away with walking.   °· The discomfort is usually felt in the top of the uterus and spreads to the lower abdomen and low back.   °· True labor can be  determined by your health care provider with an exam. This will show that the cervix is dilating and getting thinner.   °WHAT TO REMEMBER °· Keep up with your usual exercises and follow other instructions given by your health care provider.   °· Take medicines as directed by your health care provider.   °· Keep your regular prenatal appointments.   °· Eat and drink lightly if you think you are going into labor.   °· If Braxton Hicks contractions are making you uncomfortable:   °¨ Change your position from lying down or resting to walking, or from walking to resting.   °¨ Sit and rest in a tub of warm water.   °¨ Drink 2-3 glasses of water. Dehydration may cause these contractions.   °¨ Do slow and deep breathing several times an hour.   °WHEN SHOULD I SEEK IMMEDIATE MEDICAL CARE? °Seek immediate medical care if: °· Your contractions become stronger, more regular, and closer together.   °· You have fluid leaking or gushing from your vagina.   °· You have a fever.   °· You pass blood-tinged mucus.   °· You have vaginal bleeding.   °· You have continuous abdominal pain.   °· You have low back pain that you never had before.   °· You feel your baby's head pushing down and causing pelvic pressure.   °· Your baby is not moving as much as it used to.   °Document Released: 12/12/2005 Document Revised: 12/17/2013 Document Reviewed: 09/23/2013 °Exit[employer]® Patient Information ©2015 [employer]. This information is not intended to replace advice given to you by your health care   provider. Make sure you discuss any questions you have with your health care provider. ° °

## 2015-02-27 NOTE — MAU Note (Signed)
Patient presents to MAU with c/o possible rupture of membranes. States at 930 was in bathroom vomiting and urinated but it was a lot and unsure if she may have broken her water. Patient states has not had any leaking since; is currently wearing a pad that is dry. Denies VB or contractions at this time. +FM

## 2015-02-27 NOTE — MAU Provider Note (Signed)
None     Chief Complaint: Possible  Rupture of Membranes   Claire Weber is  29 y.o. G2P0010 at [redacted]w[redacted]d presents complaining of possible Rupture of Membranes  She states at 2130 she was in bathroom vomiting and urinated but it was a lot and unsure if she may have broken her water. Patient states has not had any leaking since; is currently wearing a pad that is dry.She is not nauseated any more. Denies VB or contractions at this time. +FM   Obstetrical/Gynecological History: OB History    Gravida Para Term Preterm AB TAB SAB Ectopic Multiple Living   Past Medical History: Past Medical History  Diagnosis Date  . Medical history non-contributory     Past Surgical History: Past Surgical History  Procedure Laterality Date  . Abdominal hernia repair      at age 53    Family History: Family History  Problem Relation Age of Onset  . Hypertension Mother   . Hyperlipidemia Mother   . Hypertension Father   . Diabetes Father     Social History: History  Substance Use Topics  . Smoking status: Never Smoker   . Smokeless tobacco: Never Used  . Alcohol Use: No     Comment: wine on occ-not now    Allergies: No Known Allergies  Meds:  Prescriptions prior to admission  Medication Sig Dispense Refill Last Dose  . acetaminophen (TYLENOL) 500 MG tablet Take 500 mg by mouth every 4 (four) hours as needed for mild pain or moderate pain.   Taking  . clindamycin (CLEOCIN T) 1 % external solution Apply 1 application topically 3 (three) times a week.    Not Taking  . Doxylamine-Pyridoxine (DICLEGIS) 10-10 MG TBEC 2 tabs q hs, if sx persist add 1 tab q am on day 3, if sx persist add 1 tab q afternoon on day 4 (Patient not taking: Reported on 01/07/2015) 100 tablet 4 Not Taking  . fluconazole (DIFLUCAN) 150 MG tablet 1 po stat; repeat in 3 days (Patient not taking: Reported on 01/07/2015) 2 tablet 2 Not Taking  . FLUOCINOLONE ACETONIDE SCALP 0.01 % OIL Apply 1 application  topically 3 (three) times a week.    Not Taking  . Prenat-FeFum-FePo-FA-Omega 3 (CONCEPT DHA) 53.5-38-1 MG CAPS Take 1 capsule by mouth daily. 30 capsule 11 Taking    Review of Systems   Constitutional: Negative for fever and chills Eyes: Negative for visual disturbances Respiratory: Negative for shortness of breath, dyspnea Cardiovascular: Negative for chest pain or palpitations  Gastrointestinal: Negative for vomiting, diarrhea and constipation Genitourinary: Negative for dysuria and urgency Musculoskeletal: Negative for back pain, joint pain, myalgias  Neurological: Negative for dizziness and headaches     Physical Exam  Blood pressure 119/76, pulse 70, temperature 98 F (36.7 C), temperature source Oral, resp. rate 18, last menstrual period 05/27/2014, SpO2 100 %. GENERAL: Well-developed, well-nourished female in no acute distress.  LUNGS: Clear to auscultation bilaterally.  HEART: Regular rate and rhythm. ABDOMEN: Soft, nontender, nondistended, gravid.  EXTREMITIES: Nontender, no edema, 2+ distal pulses. DTR's 2+ SSE:  Scant amount of normal appearing discharge.  Negative pooling,valsalva and fern Cervix closed   Presentation: cephalic FHT:  Baseline rate 150 bpm   Variability moderate  Accelerations present   Decelerations none Contractions: Every 0 mins   Labs: Results for orders placed or performed in visit on 02/26/15 (from the past 24 hour(s))  POCT urinalysis dipstick   Collection Time: 02/26/15  4:13 PM  Result Value Ref Range   Color, UA yellow    Clarity, UA clear    Glucose, UA neg    Bilirubin, UA     Ketones, UA neg    Spec Grav, UA     Blood, UA neg    pH, UA     Protein, UA trace    Urobilinogen, UA     Nitrite, UA neg    Leukocytes, UA Negative    Imaging Studies:  No results found.  Assessment: Claire Weber is  29 y.o. G2P0010 at 6173w3d presents with stress incontinence.  Plan: DC home  CRESENZO-DISHMAN,Arrie Borrelli 3/4/20161:32 AM

## 2015-03-04 ENCOUNTER — Encounter: Payer: Self-pay | Admitting: Advanced Practice Midwife

## 2015-03-04 ENCOUNTER — Ambulatory Visit (INDEPENDENT_AMBULATORY_CARE_PROVIDER_SITE_OTHER): Payer: PRIVATE HEALTH INSURANCE | Admitting: Advanced Practice Midwife

## 2015-03-04 VITALS — BP 120/80 | HR 80 | Wt 243.0 lb

## 2015-03-04 DIAGNOSIS — Z1389 Encounter for screening for other disorder: Secondary | ICD-10-CM

## 2015-03-04 DIAGNOSIS — Z331 Pregnant state, incidental: Secondary | ICD-10-CM

## 2015-03-04 DIAGNOSIS — Z3483 Encounter for supervision of other normal pregnancy, third trimester: Secondary | ICD-10-CM

## 2015-03-04 LAB — POCT URINALYSIS DIPSTICK
Glucose, UA: NEGATIVE
Ketones, UA: NEGATIVE
Leukocytes, UA: NEGATIVE
Nitrite, UA: NEGATIVE
Protein, UA: 1
RBC UA: NEGATIVE

## 2015-03-04 NOTE — Progress Notes (Signed)
G2P0010 579w1d Estimated Date of Delivery: 03/10/15  Blood pressure 120/80, pulse 80, weight 243 lb (110.224 kg), last menstrual period 05/27/2014.   BP weight and urine results all reviewed and noted.  Please refer to the obstetrical flow sheet for the fundal height and fetal heart rate documentation:  Patient reports good fetal movement, denies any bleeding and no rupture of membranes symptoms or regular contractions. Patient is without complaints. All questions were answered.  Plan:  Continued routine obstetrical care,   Follow up in 1 weeks for OB appointment,

## 2015-03-10 ENCOUNTER — Telehealth: Payer: Self-pay | Admitting: *Deleted

## 2015-03-10 NOTE — Telephone Encounter (Signed)
Pt states she is still working and will need her dates changed on her FMLA for accurate date she delivers. Pt informed have the FMLA and will change the date on the forms to actual date of her delivery. Pt verbalized understanding.

## 2015-03-11 ENCOUNTER — Ambulatory Visit (INDEPENDENT_AMBULATORY_CARE_PROVIDER_SITE_OTHER): Payer: PRIVATE HEALTH INSURANCE | Admitting: Women's Health

## 2015-03-11 ENCOUNTER — Encounter: Payer: Self-pay | Admitting: Women's Health

## 2015-03-11 VITALS — BP 106/78 | HR 72 | Wt 242.0 lb

## 2015-03-11 DIAGNOSIS — Z3483 Encounter for supervision of other normal pregnancy, third trimester: Secondary | ICD-10-CM

## 2015-03-11 DIAGNOSIS — Z331 Pregnant state, incidental: Secondary | ICD-10-CM

## 2015-03-11 DIAGNOSIS — Z1389 Encounter for screening for other disorder: Secondary | ICD-10-CM

## 2015-03-11 LAB — POCT URINALYSIS DIPSTICK
Blood, UA: NEGATIVE
GLUCOSE UA: NEGATIVE
Ketones, UA: NEGATIVE
LEUKOCYTES UA: NEGATIVE
NITRITE UA: NEGATIVE

## 2015-03-11 NOTE — Patient Instructions (Addendum)
Your induction is scheduled for Tuesday 3/22 @ 7:30pm. [facility] entrance and let them know you are there to be induced. They will send someone from Labor & Delivery to come get you.   Call the office [phone]) or go to [facility] if:  You begin to have strong, frequent contractions  Your water breaks.  Sometimes it is a big gush of fluid, sometimes it is just a trickle that keeps getting your panties wet or running down your legs  You have vaginal bleeding.  It is normal to have a small amount of spotting if your cervix was checked.   You don't feel your baby moving like normal.  If you don't, get you something to eat and drink and lay down and focus on feeling your baby move.  You should feel at least 10 movements in 2 hours.  If you don't, you should call the office or go [facility].    [facility] Contractions of the uterus can occur throughout pregnancy. Contractions are not always a sign that you are in labor.  WHAT ARE BRAXTON HICKS CONTRACTIONS?  Contractions that occur before labor are called Braxton Hicks contractions, or false labor. Toward the end of pregnancy (32-34 weeks), these contractions can develop more often and may become more forceful. This is not true labor because these contractions do not result in opening (dilatation) and thinning of the cervix. They are sometimes difficult to tell apart from true labor because these contractions can be forceful and people have different pain tolerances. You should not feel embarrassed if you go to the hospital with false labor. Sometimes, the only way to tell if you are in true labor is for your health care provider to look for changes in the cervix. If there are no prenatal problems or other health problems associated with the pregnancy, it is completely safe to be sent home with false labor and await the onset of true labor. HOW CAN YOU TELL THE DIFFERENCE  BETWEEN TRUE AND FALSE LABOR? False Labor  The contractions of false labor are usually shorter and not as hard as those of true labor.   The contractions are usually irregular.   The contractions are often felt in the front of the lower abdomen and in the groin.   The contractions may go away when you walk around or change positions while lying down.   The contractions get weaker and are shorter lasting as time goes on.   The contractions do not usually become progressively stronger, regular, and closer together as with true labor.  True Labor  Contractions in true labor last 30-70 seconds, become very regular, usually become more intense, and increase in frequency.   The contractions do not go away with walking.   The discomfort is usually felt in the top of the uterus and spreads to the lower abdomen and low back.   True labor can be determined by your health care provider with an exam. This will show that the cervix is dilating and getting thinner.  WHAT TO REMEMBER  Keep up with your usual exercises and follow other instructions given by your health care provider.   Take medicines as directed by your health care provider.   Keep your regular prenatal appointments.   Eat and drink lightly if you think you are going into labor.   If Braxton Hicks contractions are making you uncomfortable:   Change your position from lying down or  resting to walking, or from walking to resting.   Sit and rest in a tub of warm water.   Drink 2-3 glasses of water. Dehydration may cause these contractions.   Do slow and deep breathing several times an hour.  WHEN SHOULD I SEEK IMMEDIATE MEDICAL CARE? Seek immediate medical care if:  Your contractions become stronger, more regular, and closer together.   You have fluid leaking or gushing from your vagina.   You have a fever.   You pass blood-tinged mucus.   You have vaginal bleeding.   You have continuous  abdominal pain.   You have low back pain that you never had before.   You feel your baby's head pushing down and causing pelvic pressure.   Your baby is not moving as much as it used to.  Document Released: 12/12/2005 Document Revised: 12/17/2013 Document Reviewed: 09/23/2013 [facility] Patient Information 2015 [place], [place]. This information is not intended to replace advice given to you by your health care provider. Make sure you discuss any questions you have with your health care provider.

## 2015-03-11 NOTE — Progress Notes (Signed)
Low-risk OB appointment G2P0010 6864w1d Estimated Date of Delivery: 03/10/15 BP 106/78 mmHg  Pulse 72  Wt 242 lb (109.77 kg)  LMP 05/27/2014  BP, weight, and urine reviewed.  Refer to obstetrical flow sheet for FH & FHR.  Reports good fm.  Denies regular uc's, lof, vb, or uti s/s. No complaints. SVE per request: 1/70/-2, vtx. Offered membrane sweeping, discussed r/b- declined Reviewed labor s/s, fkc. Plan:  Continue routine obstetrical care, IOL 3/22 @ 1930 for PD F/U in 4-6wks for pp visit

## 2015-03-13 DIAGNOSIS — Z029 Encounter for administrative examinations, unspecified: Secondary | ICD-10-CM

## 2015-03-15 ENCOUNTER — Inpatient Hospital Stay (HOSPITAL_COMMUNITY)
Admission: AD | Admit: 2015-03-15 | Discharge: 2015-03-17 | DRG: 775 | Disposition: A | Discharge status: Home / Self Care | Payer: PRIVATE HEALTH INSURANCE | Source: Ambulatory Visit | Attending: Obstetrics & Gynecology | Admitting: Obstetrics & Gynecology

## 2015-03-15 ENCOUNTER — Inpatient Hospital Stay (HOSPITAL_COMMUNITY): Payer: PRIVATE HEALTH INSURANCE | Admitting: Anesthesiology

## 2015-03-15 ENCOUNTER — Encounter (HOSPITAL_COMMUNITY): Payer: Self-pay | Admitting: *Deleted

## 2015-03-15 DIAGNOSIS — O9962 Diseases of the digestive system complicating childbirth: Secondary | ICD-10-CM | POA: Diagnosis present

## 2015-03-15 DIAGNOSIS — O4290 Premature rupture of membranes, unspecified as to length of time between rupture and onset of labor, unspecified weeks of gestation: Secondary | ICD-10-CM | POA: Diagnosis present

## 2015-03-15 DIAGNOSIS — Z3483 Encounter for supervision of other normal pregnancy, third trimester: Secondary | ICD-10-CM

## 2015-03-15 DIAGNOSIS — Z833 Family history of diabetes mellitus: Secondary | ICD-10-CM

## 2015-03-15 DIAGNOSIS — Z3A40 40 weeks gestation of pregnancy: Secondary | ICD-10-CM | POA: Diagnosis present

## 2015-03-15 DIAGNOSIS — Z8249 Family history of ischemic heart disease and other diseases of the circulatory system: Secondary | ICD-10-CM

## 2015-03-15 DIAGNOSIS — O468X2 Other antepartum hemorrhage, second trimester: Secondary | ICD-10-CM

## 2015-03-15 DIAGNOSIS — K219 Gastro-esophageal reflux disease without esophagitis: Secondary | ICD-10-CM | POA: Diagnosis present

## 2015-03-15 DIAGNOSIS — O99824 Streptococcus B carrier state complicating childbirth: Secondary | ICD-10-CM | POA: Diagnosis not present

## 2015-03-15 DIAGNOSIS — IMO0001 Reserved for inherently not codable concepts without codable children: Secondary | ICD-10-CM

## 2015-03-15 DIAGNOSIS — O42019 Preterm premature rupture of membranes, onset of labor within 24 hours of rupture, unspecified trimester: Secondary | ICD-10-CM

## 2015-03-15 DIAGNOSIS — O4292 Full-term premature rupture of membranes, unspecified as to length of time between rupture and onset of labor: Secondary | ICD-10-CM | POA: Diagnosis present

## 2015-03-15 DIAGNOSIS — O418X20 Other specified disorders of amniotic fluid and membranes, second trimester, not applicable or unspecified: Secondary | ICD-10-CM

## 2015-03-15 LAB — CBC
HCT: 34.4 % — ABNORMAL LOW (ref 36.0–46.0)
Hemoglobin: 11.7 g/dL — ABNORMAL LOW (ref 12.0–15.0)
MCH: 30.5 pg (ref 26.0–34.0)
MCHC: 34 g/dL (ref 30.0–36.0)
MCV: 89.6 fL (ref 78.0–100.0)
PLATELETS: 153 10*3/uL (ref 150–400)
RBC: 3.84 MIL/uL — ABNORMAL LOW (ref 3.87–5.11)
RDW: 13.6 % (ref 11.5–15.5)
WBC: 5.6 10*3/uL (ref 4.0–10.5)

## 2015-03-15 LAB — TYPE AND SCREEN
ABO/RH(D): O POS
ANTIBODY SCREEN: NEGATIVE

## 2015-03-15 LAB — POCT FERN TEST: POCT FERN TEST: POSITIVE

## 2015-03-15 MED ORDER — LIDOCAINE HCL (PF) 1 % IJ SOLN
30.0000 mL | INTRAMUSCULAR | Status: DC | PRN
  Filled 2015-03-15: qty 30

## 2015-03-15 MED ORDER — EPHEDRINE 5 MG/ML INJ
10.0000 mg | INTRAVENOUS | Status: DC | PRN
  Filled 2015-03-15: qty 2

## 2015-03-15 MED ORDER — ACETAMINOPHEN 325 MG PO TABS: 650.0000 mg | ORAL_TABLET | ORAL | Status: DC | PRN

## 2015-03-15 MED ORDER — LACTATED RINGERS IV SOLN
500.0000 mL | INTRAVENOUS | Status: DC | PRN
  Administered 2015-03-15 – 2015-03-16 (×2): 1000 mL via INTRAVENOUS

## 2015-03-15 MED ORDER — TERBUTALINE SULFATE 1 MG/ML IJ SOLN: 0.2500 mg | Freq: Once | INTRAMUSCULAR | Status: AC | PRN

## 2015-03-15 MED ORDER — OXYCODONE-ACETAMINOPHEN 5-325 MG PO TABS: 2.0000 | ORAL_TABLET | ORAL | Status: DC | PRN

## 2015-03-15 MED ORDER — PHENYLEPHRINE 40 MCG/ML (10ML) SYRINGE FOR IV PUSH (FOR BLOOD PRESSURE SUPPORT)
80.0000 ug | PREFILLED_SYRINGE | INTRAVENOUS | Status: DC | PRN
  Filled 2015-03-15: qty 20
  Filled 2015-03-15: qty 2

## 2015-03-15 MED ORDER — PHENYLEPHRINE 40 MCG/ML (10ML) SYRINGE FOR IV PUSH (FOR BLOOD PRESSURE SUPPORT)
80.0000 ug | PREFILLED_SYRINGE | INTRAVENOUS | Status: DC | PRN
  Filled 2015-03-15: qty 2

## 2015-03-15 MED ORDER — CITRIC ACID-SODIUM CITRATE 334-500 MG/5ML PO SOLN: 30.0000 mL | ORAL | Status: DC | PRN

## 2015-03-15 MED ORDER — FENTANYL 2.5 MCG/ML BUPIVACAINE 1/10 % EPIDURAL INFUSION (WH - ANES)
14.0000 mL/h | INTRAMUSCULAR | Status: DC | PRN
  Administered 2015-03-15 (×2): 14 mL/h via EPIDURAL
  Filled 2015-03-15 (×2): qty 125

## 2015-03-15 MED ORDER — ONDANSETRON HCL 4 MG/2ML IJ SOLN
4.0000 mg | Freq: Four times a day (QID) | INTRAMUSCULAR | Status: DC | PRN
  Administered 2015-03-15: 4 mg via INTRAVENOUS
  Filled 2015-03-15: qty 2

## 2015-03-15 MED ORDER — DIPHENHYDRAMINE HCL 50 MG/ML IJ SOLN: 12.5000 mg | INTRAMUSCULAR | Status: DC | PRN

## 2015-03-15 MED ORDER — MISOPROSTOL 200 MCG PO TABS
ORAL_TABLET | ORAL | Status: AC
  Administered 2015-03-16: 1000 ug via RECTAL
  Filled 2015-03-15: qty 5

## 2015-03-15 MED ORDER — PENICILLIN G POTASSIUM 5000000 UNITS IJ SOLR
2.5000 10*6.[IU] | INTRAVENOUS | Status: DC
  Administered 2015-03-15 (×3): 2.5 10*6.[IU] via INTRAVENOUS
  Filled 2015-03-15 (×8): qty 2.5

## 2015-03-15 MED ORDER — FENTANYL CITRATE 0.05 MG/ML IJ SOLN
100.0000 ug | INTRAMUSCULAR | Status: DC | PRN
  Administered 2015-03-15 (×3): 100 ug via INTRAVENOUS
  Filled 2015-03-15 (×3): qty 2

## 2015-03-15 MED ORDER — OXYTOCIN 40 UNITS IN LACTATED RINGERS INFUSION - SIMPLE MED
1.0000 m[IU]/min | INTRAVENOUS | Status: DC
  Administered 2015-03-15: 2 m[IU]/min via INTRAVENOUS
  Filled 2015-03-15: qty 1000

## 2015-03-15 MED ORDER — OXYCODONE-ACETAMINOPHEN 5-325 MG PO TABS: 1.0000 | ORAL_TABLET | ORAL | Status: DC | PRN

## 2015-03-15 MED ORDER — LACTATED RINGERS IV SOLN
INTRAVENOUS | Status: DC
  Administered 2015-03-15: 07:00:00 via INTRAVENOUS
  Administered 2015-03-15: 125 mL/h via INTRAVENOUS

## 2015-03-15 MED ORDER — LIDOCAINE HCL (PF) 1 % IJ SOLN
INTRAMUSCULAR | Status: DC | PRN
  Administered 2015-03-15: 6 mL
  Administered 2015-03-15: 3 mL

## 2015-03-15 MED ORDER — LACTATED RINGERS IV SOLN
500.0000 mL | Freq: Once | INTRAVENOUS | Status: AC
  Administered 2015-03-15: 500 mL via INTRAVENOUS

## 2015-03-15 MED ORDER — OXYTOCIN 40 UNITS IN LACTATED RINGERS INFUSION - SIMPLE MED
62.5000 mL/h | INTRAVENOUS | Status: DC
  Administered 2015-03-16: 62.5 mL/h via INTRAVENOUS

## 2015-03-15 MED ORDER — OXYTOCIN BOLUS FROM INFUSION
500.0000 mL | INTRAVENOUS | Status: DC
  Administered 2015-03-15: 500 mL via INTRAVENOUS

## 2015-03-15 MED ORDER — PENICILLIN G POTASSIUM 5000000 UNITS IJ SOLR
5.0000 10*6.[IU] | Freq: Once | INTRAVENOUS | Status: AC
  Administered 2015-03-15: 5 10*6.[IU] via INTRAVENOUS
  Filled 2015-03-15: qty 5

## 2015-03-15 NOTE — H&P (Signed)
Claire Weber is a 29 y.o. female presenting for labor evaluation.  She reports leaking small amount of clear fluid since 0900 yesterday, 03/14/15.  She reports contractions for over a week that worsened around 3 am this morning.  She reports good fetal movement, denies vaginal bleeding, vaginal itching/burning, urinary symptoms, h/a, dizziness, n/v, or fever/chills.     [facility]  FOB [place]ra [place]ude, [place], 1st, dating  Dating By 6wk u/s  Pap 01/02/13: neg, then 2015 @ [facility] normal per pt  GC/CT Initial:   -/-             36+wks:-/-  Genetic Screen NT/IT: neg  CF screen neg  Anatomic [place] Normal female  Flu vaccine Oct @ work  Tdap Recommended ~ 28wks  Glucose Screen  2 hr  77/99/82  GBS positive  Feed Preference bottle  Contraception Undecided, discussed  Circumcision n/a  Childbirth Classes Interested, info given-didn't make it- unable to go to tour either d/t fob's schedule  Pediatrician [facility]   Maternal Medical History:  Reason for admission: Rupture of membranes and contractions.  Nausea.  Contractions: Onset was 3-5 hours ago.    Fetal activity: Perceived fetal activity is normal.   Last perceived fetal movement was within the past hour.    Prenatal complications: no prenatal complications Prenatal Complications - Diabetes: none.    OB History    Gravida Para Term Preterm AB TAB SAB Ectopic Multiple Living   Past Medical History  Diagnosis Date  . Medical history non-contributory    Past Surgical History  Procedure Laterality Date  . Abdominal hernia repair      at age 27   Family History: family history includes Diabetes in her father; Hyperlipidemia in her mother; Hypertension in her father and mother. Social History:  reports that she has never smoked. She has never used smokeless tobacco. She reports that she does not drink alcohol or use illicit drugs.   Prenatal Transfer Tool  Maternal Diabetes: No Genetic  Screening: Normal Maternal Ultrasounds/Referrals: Normal Fetal Ultrasounds or other Referrals:  None Maternal Substance Abuse:  No Significant Maternal Medications:  None Significant Maternal Lab Results:  Lab values include: Group B Strep positive Other Comments:  None  Review of Systems  Constitutional: Negative for fever, chills and malaise/fatigue.  Eyes: Negative for blurred vision.  Respiratory: Negative for cough and shortness of breath.   Cardiovascular: Negative for chest pain.  Gastrointestinal: Positive for abdominal pain. Negative for heartburn, nausea and vomiting.  Genitourinary: Negative for dysuria, urgency and frequency.  Musculoskeletal: Negative.   Neurological: Negative for dizziness and headaches.  Psychiatric/Behavioral: Negative for depression.    Dilation: 2.5 Effacement (%): 70 Station: -2 Exam by:: Misty Stanley Leftwich-Kirby CNM Blood pressure 125/64, pulse 80, temperature 98 F (36.7 C), resp. rate 22, height  (1.6 m), weight 110.768 kg (244 lb 3.2 oz), last menstrual period 05/27/2014. Maternal Exam:  Uterine Assessment: Contraction strength is mild.  Contraction duration is 60 seconds. Contraction frequency is regular.   Abdomen: Fetal presentation: vertex  Cervix: Cervix evaluated by digital exam.     Fetal Exam Fetal Monitor Review: Mode: ultrasound.   Baseline rate: 135.  Variability: moderate (6-25 bpm).   Pattern: accelerations present and no decelerations.    Fetal State Assessment: Category I - tracings are normal.     Physical Exam  Nursing note and vitals reviewed. Constitutional: She is oriented  to person, place, and time. She appears well-developed and well-nourished.  Neck: Normal range of motion.  Cardiovascular: Normal rate, regular rhythm and normal heart sounds.   Respiratory: Effort normal and breath sounds normal.  GI: Soft.  Musculoskeletal: Normal range of motion.  Neurological: She is alert and oriented to person,  place, and time.  Skin: Skin is warm and dry.  Psychiatric: She has a normal mood and affect. Her behavior is normal. Judgment and thought content normal.    Prenatal labs: ABO, Rh: --/--/O POS (07/18 1105) Antibody: NEG (12/23 1007) Rubella: 0.23 (07/22 1622) RPR: NON REAC (12/23 1007)  HBsAg: NEGATIVE (07/22 1622)  HIV: NONREACTIVE (12/23 1007)  GBS:   Positive  Assessment/Plan: G2P0010 @[redacted]w[redacted]d  PROM with onset of labor within 24 hours GBS positive  Admit to [facility] for GBS prophylaxis Expectant management Anticipate NSVD   LEFTWICH-KIRBY, Azilee 03/15/2015, 7:12 AM

## 2015-03-15 NOTE — Progress Notes (Signed)
Claire Weber is a 29 y.o. G2P0010 at 3261w5d admitted for rupture of membranes, SOL  Subjective: Relief from Fentanyl, coping well  Objective: BP 111/68 mmHg  Pulse 62  Temp(Src) 98 F (36.7 C) (Oral)  Resp 20  Ht 5\' 3"  (1.6 m)  Wt 110.678 kg (244 lb)  BMI 43.23 kg/m2  LMP 05/27/2014    FHT:  FHR: 135 bpm, variability: moderate,  accelerations:  Present,  decelerations:  Absent UC:   irregular  SVE:   Dilation: 2.5 Effacement (%): 80 Station: -2 Exam by:: Lynnea MaizesLisa Michalla Ringer [facility] FoundationCMN   Labs: Lab Results  Component Value Date   WBC 5.6 03/15/2015   HGB 11.7* 03/15/2015   HCT 34.4* 03/15/2015   MCV 89.6 03/15/2015   PLT 153 03/15/2015    Assessment / Plan: SROM >24 hours with spontaneous labor  Labor: Little progress, will augment with pitocin Fetal Wellbeing:  Category I Pain Control:  Fentanyl Pre-eclampsia: no s/s I/D:  GBS positive, receiving PCN Anticipated MOD:  NSVD  Katalin Colledge,Blakely Wynne SNM 03/15/2015, 9:41 AM

## 2015-03-15 NOTE — Progress Notes (Signed)
Sharen CounterLisa Leftwich-Kirby CNM notified of pt's admission and status. Aware of ? SROM yesterday at 0900. Will see pt

## 2015-03-15 NOTE — MAU Note (Signed)
Contractions for couple days. More intense yesterday on and off. Regular for last couple hours. Leaking clear fld since 0900.

## 2015-03-15 NOTE — MAU Note (Signed)
GInger Morris RN called report to Erven CollaMelissa Wilkins [facility]

## 2015-03-15 NOTE — Progress Notes (Signed)
Claire Weber is a 29 y.o. G2P0010 at 393w5d admitted for rupture of membranes, and SOL  Subjective: Comfortable, epidural in place.  Objective: BP 101/60 mmHg  Pulse 62  Temp(Src) 97.8 F (36.6 C) (Oral)  Resp 20  Ht 5\' 3"  (1.6 m)  Wt 110.678 kg (244 lb)  BMI 43.23 kg/m2  SpO2 100%  LMP 05/27/2014    FHT:  FHR: 130 bpm, variability: moderate,  accelerations:  Present,  decelerations:  Present variables UC:   regular, every 2-3 minutes  SVE:   Dilation: 5 Effacement (%): 80 Station: -2 Exam by:: S Grindstaff RN  Pitocin @ 10 mu/min  Labs: Lab Results  Component Value Date   WBC 5.6 03/15/2015   HGB 11.7* 03/15/2015   HCT 34.4* 03/15/2015   MCV 89.6 03/15/2015   PLT 153 03/15/2015    Assessment / Plan: Augmentation of labor, progressing well  Labor: progressing on pitocin, will continue to increase Fetal Wellbeing:  Category II Pain Control:  Epidural Pre-eclampsia: no s/s I/D:  GBS positive Anticipated MOD:  NSVD  Geana Walts,Lynette Wynne SNM 03/15/2015, 3:19 PM

## 2015-03-15 NOTE — Progress Notes (Signed)
Claire ConchLisa R Hoagland is a [age] y.o. G2P0010 at 4364w5d admitted for rupture of membranes  Subjective: Comfortable  Objective: BP 108/73 mmHg  Pulse 75  Temp(Src) 98.4 F (36.9 C) (Oral)  Resp 20  Ht 5\' 3"  (1.6 m)  Wt 110.678 kg (244 lb)  BMI 43.23 kg/m2  SpO2 100%  LMP 05/27/2014    FHT:  FHR: 140 bpm, variability: moderate,  accelerations:  Present,  decelerations:  Present early UC:   regular  SVE:   Dilation: 8 Effacement (%): 80 Station: -1 Exam by:: Lynnea MaizesLisa Nadiah Corbit SNM  Pitocin @ 8 mu/min  Labs: Lab Results  Component Value Date   WBC 5.6 03/15/2015   HGB 11.7* 03/15/2015   HCT 34.4* 03/15/2015   MCV 89.6 03/15/2015   PLT 153 03/15/2015    Assessment / Plan: Augmentation of labor, progressing well  Labor: Continue to increase pitocin Fetal Wellbeing:  Category I Pain Control:  Epidural Pre-eclampsia: no s/s I/D:  GBS positiv Anticipated MOD:  NSVD  Alexine Pilant,Jennet Wynne SNM 03/15/2015, 6:45 PM

## 2015-03-15 NOTE — Progress Notes (Signed)
Claire Weber is a 29 y.o. G2P0010 at 199w5d admitted for rupture of membranes, and SOL  Subjective: Some relief with Fentanyl, coping well.  Declines epidural at this time.  Objective: BP 111/73 mmHg  Pulse 64  Temp(Src) 97.9 F (36.6 C) (Oral)  Resp 18  Ht 5\' 3"  (1.6 m)  Wt 110.678 kg (244 lb)  BMI 43.23 kg/m2  LMP 05/27/2014    FHT:  FHR: 135 bpm, variability: moderate,  accelerations:  Present,  decelerations:  Absent UC:   regular, every 2-4 minutes  SVE:   Dilation: 4 Effacement (%): 90 Station: -2 Exam by:: Claire MaizesLisa Aleera Weber SNM  Pitocin @ 6 mu/min  Labs: Lab Results  Component Value Date   WBC 5.6 03/15/2015   HGB 11.7* 03/15/2015   HCT 34.4* 03/15/2015   MCV 89.6 03/15/2015   PLT 153 03/15/2015    Assessment / Plan: Augmentation of labor, progressing well  Labor: Progressing normally Fetal Wellbeing:  Category I Pain Control:  Fentanyl Pre-eclampsia: no s/s I/D:  GBS positive, has received 2 doses PCN Anticipated MOD:  NSVD  Claire Weber,Claire Weber SNM 03/15/2015, 12:46 PM

## 2015-03-15 NOTE — Progress Notes (Signed)
Claire Weber is a 29 y.o. G2P0010 at 7069w5d admitted for rupture of membranes, and SOL  Subjective: Comfortable with epidural  Objective: BP 114/96 mmHg  Pulse 67  Temp(Src) 97.8 F (36.6 C) (Oral)  Resp 16  Ht 5\' 3"  (1.6 m)  Wt 110.678 kg (244 lb)  BMI 43.23 kg/m2  SpO2 100%  LMP 05/27/2014    FHT:  FHR: 130 bpm, variability: moderate,  accelerations:  Present,  decelerations:  Absent UC:   regular, every 2-4 minutes  SVE:   Dilation: 6.5 Effacement (%): 80 Station: -1 Exam by:: Claire Weber SNM  Pitocin @ 6 mu/min  Labs: Lab Results  Component Value Date   WBC 5.6 03/15/2015   HGB 11.7* 03/15/2015   HCT 34.4* 03/15/2015   MCV 89.6 03/15/2015   PLT 153 03/15/2015    Assessment / Plan: Augmentation of labor, progressing well  Labor: Progressing normally Fetal Wellbeing:  Category I Pain Control:  Epidural Pre-eclampsia: no s/s I/D:  GBS positive Anticipated MOD:  NSVD  Claire Weber,Claire Weber SNM 03/15/2015, 4:09 PM

## 2015-03-15 NOTE — Anesthesia Procedure Notes (Signed)
Epidural Patient location during procedure: OB End time: 03/15/2015 2:14 PM  Staffing Anesthesiologist: Milly JakobAVULAPATI, Ova Gillentine Performed by: anesthesiologist   Preanesthetic Checklist Completed: patient identified, surgical consent, pre-op evaluation, timeout performed, IV checked, risks and benefits discussed and monitors and equipment checked  Epidural Patient position: sitting Prep: site prepped and draped and DuraPrep Patient monitoring: heart rate, cardiac monitor, continuous pulse ox and blood pressure Approach: midline Location: L3-L4 Injection technique: LOR saline  Needle:  Needle type: Tuohy  Needle gauge: 17 G Needle length: 9 cm Catheter type: closed end flexible Catheter size: 19 Gauge Catheter at skin depth: 12 cm Test dose: Other  Assessment Events: blood not aspirated, injection not painful, no injection resistance, negative IV test and no paresthesia  Additional Notes Patient identified. Risks and benefits discussed including failed block, incomplete  Pain control, post dural puncture headache, nerve damage, paralysis, blood pressure Changes, nausea, vomiting, reactions to medications-both toxic and allergic and post Partum back pain. All questions were answered. Patient expressed understanding and wished to proceed. Sterile technique was used throughout procedure. Epidural site was Dressed with sterile barrier dressing. No paresthesias, signs of intravascular injection Or signs of intrathecal spread were encountered.  Patient was more comfortable after the epidural was dosed. Please see RN's note for documentation of vital signs and FHR which are stable. Reason for block:procedure for pain

## 2015-03-15 NOTE — Anesthesia Preprocedure Evaluation (Signed)
Anesthesia Evaluation  Patient identified by MRN, date of birth, ID band Patient awake    Reviewed: Allergy & Precautions, NPO status , Patient's Chart, lab work & pertinent test results  Airway Mallampati: II   Neck ROM: Full    Dental   Pulmonary neg pulmonary ROS,  breath sounds clear to auscultation        Cardiovascular negative cardio ROS  Rhythm:Regular Rate:Normal     Neuro/Psych negative neurological ROS     GI/Hepatic Neg liver ROS, GERD-  ,  Endo/Other    Renal/GU negative Renal ROS     Musculoskeletal negative musculoskeletal ROS (+)   Abdominal   Peds negative pediatric ROS (+)  Hematology negative hematology ROS (+)   Anesthesia Other Findings   Reproductive/Obstetrics                             Anesthesia Physical Anesthesia Plan  ASA: III  Anesthesia Plan: Epidural   Post-op Pain Management:    Induction:   Airway Management Planned:   Additional Equipment:   Intra-op Plan:   Post-operative Plan:   Informed Consent: I have reviewed the patients History and Physical, chart, labs and discussed the procedure including the risks, benefits and alternatives for the proposed anesthesia with the patient or authorized representative who has indicated his/her understanding and acceptance.     Plan Discussed with: Anesthesiologist  Anesthesia Plan Comments:         Anesthesia Quick Evaluation

## 2015-03-15 NOTE — Progress Notes (Signed)
emesis

## 2015-03-15 NOTE — Progress Notes (Signed)
Pt leaning over side of bed throwing up

## 2015-03-16 ENCOUNTER — Encounter (HOSPITAL_COMMUNITY): Payer: Self-pay

## 2015-03-16 LAB — CBC
HCT: 30.5 % — ABNORMAL LOW (ref 36.0–46.0)
Hemoglobin: 10.1 g/dL — ABNORMAL LOW (ref 12.0–15.0)
MCH: 29.7 pg (ref 26.0–34.0)
MCHC: 33.1 g/dL (ref 30.0–36.0)
MCV: 89.7 fL (ref 78.0–100.0)
Platelets: 147 10*3/uL — ABNORMAL LOW (ref 150–400)
RBC: 3.4 MIL/uL — ABNORMAL LOW (ref 3.87–5.11)
RDW: 13.9 % (ref 11.5–15.5)
WBC: 15.7 10*3/uL — AB (ref 4.0–10.5)

## 2015-03-16 LAB — RPR: RPR: NONREACTIVE

## 2015-03-16 MED ORDER — BENZOCAINE-MENTHOL 20-0.5 % EX AERO
1.0000 "application " | INHALATION_SPRAY | CUTANEOUS | Status: DC | PRN
  Filled 2015-03-16: qty 56

## 2015-03-16 MED ORDER — IBUPROFEN 100 MG/5ML PO SUSP
600.0000 mg | Freq: Four times a day (QID) | ORAL | Status: DC
  Administered 2015-03-16 – 2015-03-17 (×6): 600 mg via ORAL
  Filled 2015-03-16 (×10): qty 30

## 2015-03-16 MED ORDER — OXYCODONE-ACETAMINOPHEN 5-325 MG PO TABS: 1.0000 | ORAL_TABLET | ORAL | Status: DC | PRN

## 2015-03-16 MED ORDER — WITCH HAZEL-GLYCERIN EX PADS: 1.0000 "application " | MEDICATED_PAD | CUTANEOUS | Status: DC | PRN

## 2015-03-16 MED ORDER — ONDANSETRON HCL 4 MG/2ML IJ SOLN: 4.0000 mg | INTRAMUSCULAR | Status: DC | PRN

## 2015-03-16 MED ORDER — MISOPROSTOL 200 MCG PO TABS
1000.0000 ug | ORAL_TABLET | Freq: Once | ORAL | Status: AC
  Administered 2015-03-16: 1000 ug via RECTAL

## 2015-03-16 MED ORDER — SIMETHICONE 80 MG PO CHEW: 80.0000 mg | CHEWABLE_TABLET | ORAL | Status: DC | PRN

## 2015-03-16 MED ORDER — ONDANSETRON HCL 4 MG PO TABS: 4.0000 mg | ORAL_TABLET | ORAL | Status: DC | PRN

## 2015-03-16 MED ORDER — IBUPROFEN 600 MG PO TABS
600.0000 mg | ORAL_TABLET | Freq: Four times a day (QID) | ORAL | Status: DC
  Filled 2015-03-16: qty 1

## 2015-03-16 MED ORDER — LANOLIN HYDROUS EX OINT: TOPICAL_OINTMENT | CUTANEOUS | Status: DC | PRN

## 2015-03-16 MED ORDER — PRENATAL MULTIVITAMIN CH: 1.0000 | ORAL_TABLET | Freq: Every day | ORAL | Status: DC

## 2015-03-16 MED ORDER — TETANUS-DIPHTH-ACELL PERTUSSIS 5-2.5-18.5 LF-MCG/0.5 IM SUSP
0.5000 mL | Freq: Once | INTRAMUSCULAR | Status: AC
  Administered 2015-03-17: 0.5 mL via INTRAMUSCULAR

## 2015-03-16 MED ORDER — DIPHENHYDRAMINE HCL 25 MG PO CAPS: 25.0000 mg | ORAL_CAPSULE | Freq: Four times a day (QID) | ORAL | Status: DC | PRN

## 2015-03-16 MED ORDER — ACETAMINOPHEN 325 MG PO TABS: 650.0000 mg | ORAL_TABLET | ORAL | Status: DC | PRN

## 2015-03-16 MED ORDER — SENNOSIDES-DOCUSATE SODIUM 8.6-50 MG PO TABS
2.0000 | ORAL_TABLET | ORAL | Status: DC
  Administered 2015-03-17: 2 via ORAL
  Filled 2015-03-16: qty 2

## 2015-03-16 MED ORDER — DIBUCAINE 1 % RE OINT: 1.0000 "application " | TOPICAL_OINTMENT | RECTAL | Status: DC | PRN

## 2015-03-16 MED ORDER — ZOLPIDEM TARTRATE 5 MG PO TABS: 5.0000 mg | ORAL_TABLET | Freq: Every evening | ORAL | Status: DC | PRN

## 2015-03-16 NOTE — Progress Notes (Signed)
Post Partum Day 1 Subjective: no complaints, up ad lib, voiding, tolerating PO and + flatus  Objective: Blood pressure 110/52, pulse 78, temperature 98 F (36.7 C), temperature source Oral, resp. rate 18, height 5\' 3"  (1.6 m), weight 244 lb (110.678 kg), last menstrual period 05/27/2014, SpO2 100 %, unknown if currently breastfeeding.  Physical Exam:  General: alert, cooperative, appears stated age and no distress Lochia: appropriate Uterine Fundus: firm Incision: healing well DVT Evaluation: No evidence of DVT seen on physical exam. Negative Homan's sign. No cords or calf tenderness. No significant calf/ankle edema.   Recent Labs  03/15/15 0710 03/16/15 0600  HGB 11.7* 10.1*  HCT 34.4* 30.5*    Assessment/Plan: Plan for discharge tomorrow   LOS: 1 day   LAWSON, MARIE DARLENE 03/16/2015, 7:14 AM

## 2015-03-16 NOTE — Anesthesia Postprocedure Evaluation (Signed)
  Anesthesia Post-op Note  Patient: Claire Weber  Procedure(s) Performed: * No procedures listed *  Patient Location: Mother/Baby  Anesthesia Type:Epidural  Level of Consciousness: awake, alert , oriented and patient cooperative  Airway and Oxygen Therapy: Patient Spontanous Breathing  Post-op Pain: mild  Post-op Assessment: Patient's Cardiovascular Status Stable, Respiratory Function Stable, No headache, No backache, No residual numbness and No residual motor weakness  Post-op Vital Signs: stable  Last Vitals:  Filed Vitals:   03/16/15 0605  BP: 110/52  Pulse: 78  Temp: 36.7 C  Resp:     Complications: No apparent anesthesia complications

## 2015-03-17 ENCOUNTER — Inpatient Hospital Stay (HOSPITAL_COMMUNITY): Admission: RE | Admit: 2015-03-17 | Payer: Managed Care, Other (non HMO) | Source: Ambulatory Visit

## 2015-03-17 MED ORDER — MEASLES, MUMPS & RUBELLA VAC ~~LOC~~ INJ
0.5000 mL | INJECTION | Freq: Once | SUBCUTANEOUS | Status: AC
  Administered 2015-03-17: 0.5 mL via SUBCUTANEOUS
  Filled 2015-03-17 (×2): qty 0.5

## 2015-03-17 MED ORDER — IBUPROFEN 100 MG/5ML PO SUSP: 600.0000 mg | Freq: Four times a day (QID) | ORAL | Status: DC

## 2015-03-17 NOTE — Discharge Summary (Signed)
Obstetric Discharge Summary Reason for Admission: rupture of membranes Prenatal Procedures: none Intrapartum Procedures: spontaneous vaginal delivery Postpartum Procedures: none Complications-Operative and Postpartum: 1st degree perineal laceration requiring suture repair  Delivery Note At 11:38 PM a healthy female was delivered via (Presentation: cephalic ; occiput anterior ). APGAR: 8, 9; weight pending.  Placenta status: spontaneous, intact . Cord: 3 vessel cord with the following complications: none. Loose nuchal cord noted at delivery.   Anesthesia: Epidural  Episiotomy: None Lacerations: 1st Degree Suture Repair: 3.0 vicryl Est. Blood Loss (mL): 300  Mom to postpartum. Baby to Couplet care / Skin to Skin.  Claire Weber, Laverne N 03/16/2015, 12:02 AM   Hospital Course:  Active Problems:   PROM (premature rupture of membranes)   Claire Weber is a 29 y.o. G2P1011.  Patient presented at 2852w5d with PROM and was admitted to L&D.  She has postpartum course that was uncomplicated including no problems with ambulating, PO intake, urination, pain, or bleeding. The pt feels ready to go home and  will be discharged with outpatient follow-up.   Today: No acute events overnight.  Pt denies problems with ambulating, voiding or po intake.  She denies nausea or vomiting.  Pain is well controlled.  She has had flatus. She has not had bowel movement despite PO Senokot.  Lochia Small.  Plan for birth control is undecided between OCPs or IUD.  Method of Feeding: Bottle, formula   H/H: Lab Results  Component Value Date/Time   HGB 10.1* 03/16/2015 06:00 AM   HCT 30.5* 03/16/2015 06:00 AM    Discharge Diagnoses: Term Pregnancy-delivered  Discharge Information: Date: 03/17/2015 Activity: pelvic rest Diet: routine  Medications: Ibuprofen and Tylenol and Senokot-S Breast feeding:  No: Prefers to bottle feed Condition: stable Instructions: refer to handout Discharge to: home       Medication List    TAKE these medications        acetaminophen 500 MG tablet  Commonly known as:  TYLENOL  Take 500 mg by mouth every 4 (four) hours as needed for mild pain or moderate pain.     clindamycin 1 % external solution  Commonly known as:  CLEOCIN T  Apply 1 application topically 3 (three) times a week.     CONCEPT DHA 53.5-38-1 MG Caps  Take 1 capsule by mouth daily.     Doxylamine-Pyridoxine 10-10 MG Tbec  Commonly known as:  DICLEGIS  2 tabs q hs, if sx persist add 1 tab q am on day 3, if sx persist add 1 tab q afternoon on day 4     FLUOCINOLONE ACETONIDE SCALP 0.01 % Oil  Apply 1 application topically 3 (three) times a week.     ibuprofen 100 MG/5ML suspension  Commonly known as:  ADVIL,MOTRIN  Take 30 mLs (600 mg total) by mouth every 6 (six) hours.       Follow-up Information    Follow up with [facility] In 6 weeks.   Why:  Postpartum visit   Contact information:   [address] [place] [phone]      Acie FredricksonBryson Shepherd- PA Student   Attestation of Attending Supervision of Physician Assistant Student: Evaluation and management procedures were performed by the PA Student under my supervision.  I have seen and examined the patient, reviewed the the student's note and chart, and I agree with management and plan.  BP 122/83 mmHg  Pulse 78  Temp(Src) 97.8 F (36.6 C) (Oral)  Resp 18  Ht  (1.6 m)  Wt 244 lb (110.678 kg)  BMI 43.23 kg/m2  SpO2 99%  LMP 05/27/2014  Breastfeeding? Unknown Benign abdomen, fundus firm Minimal lochia noted Benign extremity exam   Will discharge to home. Follow up with [facility].  Jaynie Collins, MD, FACOG Attending Obstetrician & Gynecologist Faculty Practice, [facility]

## 2015-03-17 NOTE — Discharge Instructions (Signed)

## 2015-04-02 ENCOUNTER — Telehealth: Payer: Self-pay | Admitting: *Deleted

## 2015-04-02 NOTE — Telephone Encounter (Signed)
Pt requesting dates to be changed on FMLA. Pt states she did not go out on leave until 03/09/2015, her EDD date but [facility] had [facility]. Informed pt would review forms and refax if needed to [phone] Attn: Judeth CornfieldStephanie. Pt verbalized understanding.

## 2015-04-22 ENCOUNTER — Ambulatory Visit (INDEPENDENT_AMBULATORY_CARE_PROVIDER_SITE_OTHER): Payer: PRIVATE HEALTH INSURANCE | Admitting: Advanced Practice Midwife

## 2015-04-22 ENCOUNTER — Encounter: Payer: Self-pay | Admitting: Advanced Practice Midwife

## 2015-04-22 MED ORDER — ESCITALOPRAM OXALATE 10 MG PO TABS: 10.0000 mg | ORAL_TABLET | Freq: Every day | ORAL | Status: DC

## 2015-04-22 NOTE — Progress Notes (Signed)
  Claire Weber is a 29 y.o. who presents for a postpartum visit. She is 5 weeks postpartum following a spontaneous vaginal delivery. I have fully reviewed the prenatal and intrapartum course. The delivery was at 40.5 gestational weeks.  Anesthesia: epidural. Postpartum course has been uneventful. Baby's course has been uneventful. Baby is feeding by bottle. Bleeding: started period today. . Bowel function is normal. Bladder function is normal. Patient is sexually active. Contraception method is condoms. Postpartum depression screening: equivocal.  Pt has hx of depression and some anxiety.  Took prozac in past, felt it didn't do any good.  Declines referral for therapy  Requests SSRI.    Current outpatient prescriptions:  .  acetaminophen (TYLENOL) 500 MG tablet, Take 500 mg by mouth every 4 (four) hours as needed for mild pain or moderate pain., Disp: , Rfl:  .  FLUOCINOLONE ACETONIDE SCALP 0.01 % OIL, Apply 1 application topically 3 (three) times a week. , Disp: , Rfl:  .  Prenat-FeFum-FePo-FA-Omega 3 (CONCEPT DHA) 53.5-38-1 MG CAPS, Take 1 capsule by mouth daily., Disp: 30 capsule, Rfl: 11 .  clindamycin (CLEOCIN T) 1 % external solution, Apply 1 application topically 3 (three) times a week. , Disp: , Rfl:   Review of Systems   Constitutional: Negative for fever and chills Eyes: Negative for visual disturbances Respiratory: Negative for shortness of breath, dyspnea Cardiovascular: Negative for chest pain or palpitations  Gastrointestinal: Negative for vomiting, diarrhea and constipation Genitourinary: Negative for dysuria and urgency Musculoskeletal: Negative for back pain, joint pain, myalgias  Neurological: Negative for dizziness and headaches   Objective:    There were no vitals filed for this visit. General:  alert, cooperative and no distress   Breasts:  negative  Lungs: clear to auscultation bilaterally  Heart:  regular rate and rhythm  Abdomen: Soft, nontender   Vulva:  small  fissure above clitoral hood, probably occurred during sex last week  Vagina: normal vagina  Cervix:  closed  Corpus: Well involuted     Rectal Exam: no hemorrhoids        Assessment:    normal postpartum exam.  Plan:    1. Contraception: IUD 2. Follow up in: 1 week for IUD.  3.  Rx Lexapro 10mg

## 2015-04-30 ENCOUNTER — Ambulatory Visit: Payer: PRIVATE HEALTH INSURANCE | Admitting: Advanced Practice Midwife

## 2015-05-09 ENCOUNTER — Other Ambulatory Visit: Payer: Self-pay | Admitting: Advanced Practice Midwife

## 2015-05-11 ENCOUNTER — Telehealth: Payer: Self-pay | Admitting: Women's Health

## 2015-05-11 ENCOUNTER — Encounter: Payer: Self-pay | Admitting: Women's Health

## 2015-05-11 ENCOUNTER — Ambulatory Visit (INDEPENDENT_AMBULATORY_CARE_PROVIDER_SITE_OTHER): Payer: PRIVATE HEALTH INSURANCE | Admitting: Women's Health

## 2015-05-11 VITALS — BP 126/78 | HR 72 | Wt 223.0 lb

## 2015-05-11 DIAGNOSIS — Z30430 Encounter for insertion of intrauterine contraceptive device: Secondary | ICD-10-CM | POA: Diagnosis not present

## 2015-05-11 DIAGNOSIS — O99345 Other mental disorders complicating the puerperium: Secondary | ICD-10-CM

## 2015-05-11 DIAGNOSIS — F53 Postpartum depression: Secondary | ICD-10-CM

## 2015-05-11 DIAGNOSIS — Z30014 Encounter for initial prescription of intrauterine contraceptive device: Secondary | ICD-10-CM

## 2015-05-11 LAB — BETA HCG QUANT (REF LAB)

## 2015-05-11 MED ORDER — SERTRALINE HCL 25 MG PO TABS: 25.0000 mg | ORAL_TABLET | Freq: Every day | ORAL | Status: DC

## 2015-05-11 NOTE — Patient Instructions (Signed)
 Nothing in vagina for 3 days (no sex, douching, tampons, etc...)  Check your strings once a month to make sure you can feel them, if you are not able to please let us know  If you develop a fever of 100.4 or more in the next few weeks, or if you develop severe abdominal pain, please let us know  Use a backup method of birth control, such as condoms, for 2 weeks    Levonorgestrel intrauterine device (IUD)- Liletta What is this medicine? LEVONORGESTREL IUD (LEE voe nor jes trel) is a contraceptive (birth control) device. The device is placed inside the uterus by a healthcare professional. It is used to prevent pregnancy and can also be used to treat heavy bleeding that occurs during your period. Depending on the device, it can be used for 3 to 5 years. This medicine may be used for other purposes; ask your health care provider or pharmacist if you have questions. COMMON BRAND NAME(S): LILETTA, Mirena, Skyla What should I tell my health care provider before I take this medicine? They need to know if you have any of these conditions: -abnormal Pap smear -cancer of the breast, uterus, or cervix -diabetes -endometritis -genital or pelvic infection now or in the past -have more than one sexual partner or your partner has more than one partner -heart disease -history of an ectopic or tubal pregnancy -immune system problems -IUD in place -liver disease or tumor -problems with blood clots or take blood-thinners -use intravenous drugs -uterus of unusual shape -vaginal bleeding that has not been explained -an unusual or allergic reaction to levonorgestrel, other hormones, silicone, or polyethylene, medicines, foods, dyes, or preservatives -pregnant or trying to get pregnant -breast-feeding How should I use this medicine? This device is placed inside the uterus by a health care professional. Talk to your pediatrician regarding the use of this medicine in children. Special care may be  needed. Overdosage: If you think you have taken too much of this medicine contact a poison control center or emergency room at once. NOTE: This medicine is only for you. Do not share this medicine with others. What if I miss a dose? This does not apply. What may interact with this medicine? Do not take this medicine with any of the following medications: -amprenavir -bosentan -fosamprenavir This medicine may also interact with the following medications: -aprepitant -barbiturate medicines for inducing sleep or treating seizures -bexarotene -griseofulvin -medicines to treat seizures like carbamazepine, ethotoin, felbamate, oxcarbazepine, phenytoin, topiramate -modafinil -pioglitazone -rifabutin -rifampin -rifapentine -some medicines to treat HIV infection like atazanavir, indinavir, lopinavir, nelfinavir, tipranavir, ritonavir -St. John's wort -warfarin This list may not describe all possible interactions. Give your health care provider a list of all the medicines, herbs, non-prescription drugs, or dietary supplements you use. Also tell them if you smoke, drink alcohol, or use illegal drugs. Some items may interact with your medicine. What should I watch for while using this medicine? Visit your doctor or health care professional for regular check ups. See your doctor if you or your partner has sexual contact with others, becomes HIV positive, or gets a sexual transmitted disease. This product does not protect you against HIV infection (AIDS) or other sexually transmitted diseases. You can check the placement of the IUD yourself by reaching up to the top of your vagina with clean fingers to feel the threads. Do not pull on the threads. It is a good habit to check placement after each menstrual period. Call your doctor right away if   you feel more of the IUD than just the threads or if you cannot feel the threads at all. The IUD may come out by itself. You may become pregnant if the device  comes out. If you notice that the IUD has come out use a backup birth control method like condoms and call your health care provider. Using tampons will not change the position of the IUD and are okay to use during your period. What side effects may I notice from receiving this medicine? Side effects that you should report to your doctor or health care professional as soon as possible: -allergic reactions like skin rash, itching or hives, swelling of the face, lips, or tongue -fever, flu-like symptoms -genital sores -high blood pressure -no menstrual period for 6 weeks during use -pain, swelling, warmth in the leg -pelvic pain or tenderness -severe or sudden headache -signs of pregnancy -stomach cramping -sudden shortness of breath -trouble with balance, talking, or walking -unusual vaginal bleeding, discharge -yellowing of the eyes or skin Side effects that usually do not require medical attention (report to your doctor or health care professional if they continue or are bothersome): -acne -breast pain -change in sex drive or performance -changes in weight -cramping, dizziness, or faintness while the device is being inserted -headache -irregular menstrual bleeding within first 3 to 6 months of use -nausea This list may not describe all possible side effects. Call your doctor for medical advice about side effects. You may report side effects to FDA at 1-800-FDA-1088. Where should I keep my medicine? This does not apply. NOTE: This sheet is a summary. It may not cover all possible information. If you have questions about this medicine, talk to your doctor, pharmacist, or health care provider.  2015, Elsevier/Gold Standard. (2012-01-12 13:54:04)  

## 2015-05-11 NOTE — Progress Notes (Signed)
Patient ID: Claire Weber, female   DOB: [birth year], 29 y.o.   MRN: [id number] Claire Weber is a 29 y.o. year old 132P1011 African American female who presents for placement of a Liletta IUD.  Patient's last menstrual period was 04/22/2015. BP 126/78 mmHg  Pulse 72  Wt 223 lb (101.152 kg)  LMP 04/22/2015  Breastfeeding? No Last sexual intercourse was 5/4, and bhcg on 5/14 was neg, she has not had sex since.   Has not started lexapro, was too expensive. Denies SI/HI/II, doesn't find as much joy in things she used to, decreased appetite, not sleeping well.  Rx zoloft 25mg  daily to try. Declines counseling at this time.   The risks and benefits of the method and placement have been thouroughly reviewed with the patient and all questions were answered.  Specifically the patient is aware of failure rate of 12/998, expulsion of the IUD and of possible perforation.  The patient is aware of irregular bleeding due to the method and understands the incidence of irregular bleeding diminishes with time.  Signed copy of informed consent in chart.  She understands the [place]i BombardLiletta is currently marketed for 3 years, but may be approved for 5 year and possibly 7 year use during the lifetime of her Liletta.  Time out was performed.  A graves speculum was placed in the vagina.  The cervix was visualized, prepped using Betadine, and grasped with a single tooth tenaculum. The uterus was found to be neutral and it sounded to 7 cm.  Liletta IUD placed per manufacturer's recommendations.   The strings were trimmed to 3 cm.  Sonogram was performed and the proper placement of the IUD was verified via transvaginal u/s.   The patient was given post procedure instructions, including signs and symptoms of infection and to check for the strings after each menses or each month, and refraining from intercourse or anything in the vagina for 3 days.  She was given a [place] care card with date Liletta placed, and date Liletta to  be removed.  She is scheduled for a f/u appointment in 4 weeks for IUD check up and zoloft check.   Marge DuncansBooker, Rawlins Stuard Randall CNM, [facility]NP-BC 05/11/2015 12:46 PM

## 2015-05-11 NOTE — Telephone Encounter (Signed)
Pt requesting a letter faxed to employee that she may return to work tomorrow 05/12/2015. Letter faxed to [phone] per pt request.

## 2015-05-21 ENCOUNTER — Ambulatory Visit: Payer: PRIVATE HEALTH INSURANCE | Admitting: Advanced Practice Midwife

## 2015-05-26 ENCOUNTER — Encounter: Payer: Self-pay | Admitting: *Deleted

## 2015-06-08 ENCOUNTER — Ambulatory Visit: Payer: PRIVATE HEALTH INSURANCE | Admitting: Women's Health

## 2015-06-09 ENCOUNTER — Encounter: Payer: Self-pay | Admitting: Women's Health

## 2015-06-09 ENCOUNTER — Ambulatory Visit (INDEPENDENT_AMBULATORY_CARE_PROVIDER_SITE_OTHER): Payer: PRIVATE HEALTH INSURANCE | Admitting: Women's Health

## 2015-06-09 VITALS — BP 104/66 | HR 60 | Wt 224.0 lb

## 2015-06-09 DIAGNOSIS — Z975 Presence of (intrauterine) contraceptive device: Secondary | ICD-10-CM | POA: Diagnosis not present

## 2015-06-09 DIAGNOSIS — L292 Pruritus vulvae: Secondary | ICD-10-CM

## 2015-06-09 DIAGNOSIS — B373 Candidiasis of vulva and vagina: Secondary | ICD-10-CM | POA: Diagnosis not present

## 2015-06-09 DIAGNOSIS — B3731 Acute candidiasis of vulva and vagina: Secondary | ICD-10-CM

## 2015-06-09 DIAGNOSIS — O99345 Other mental disorders complicating the puerperium: Secondary | ICD-10-CM

## 2015-06-09 DIAGNOSIS — F53 Postpartum depression: Secondary | ICD-10-CM

## 2015-06-09 LAB — POCT WET PREP (WET MOUNT): CLUE CELLS WET PREP WHIFF POC: NEGATIVE

## 2015-06-09 MED ORDER — FLUCONAZOLE 150 MG PO TABS: 150.0000 mg | ORAL_TABLET | Freq: Once | ORAL | Status: DC

## 2015-06-09 NOTE — Progress Notes (Signed)
Patient ID: KIRA GAMEL, female   DOB: 13-Apr-1986, 29 y.o.   MRN: [id number]   [facility] Visit  Patient name: Claire Weber MRN [id number]  Date of birth: 06/16/1986  CC & HPI:  Claire Weber is a 29 y.o. African American female presenting today for Liletta IUD check, placed 05/11/15, unable to feel strings. White d/c w/ vulvar itching x 1wk. Was rx'd zoloft at last visit for PPD, had previously been rx'd lexapro but was too expensive. Hasn't started zoloft- states she feels like she is improving w/o it. Denies SI/HI/II. Sleeps ok, eating ok but not like she should be. Feels some dizziness since IUD insertion. More likely r/t poor diet.   Pertinent History Reviewed:  Medical & Surgical Hx:   Past Medical History  Diagnosis Date  . Medical history non-contributory    Past Surgical History  Procedure Laterality Date  . Abdominal hernia repair      at age 44   Medications: Reviewed & Updated - see associated section Social History: Reviewed -  reports that she has never smoked. She has never used smokeless tobacco.  Objective Findings:  Vitals: BP 104/66 mmHg  Pulse 60  Wt 224 lb (101.606 kg)  LMP 05/22/2015  Breastfeeding? No  Physical Examination: General appearance - alert, well appearing, and in no distress Pelvic - normal external genitalia, vulva, vagina, cervix, uterus and adnexa, Liletta IUD strings visible, small amount thick white nonodorous d/c  Results for orders placed or performed in visit on 06/09/15 (from the past 24 hour(s))  POCT Wet Prep Mellody Drown [place])   Collection Time: 06/09/15  4:38 PM  Result Value Ref Range   Source Wet Prep POC vaginal    WBC, Wet Prep HPF POC none    Bacteria Wet Prep HPF POC None (A) Few   BACTERIA WET PREP MORPHOLOGY POC     Clue Cells Wet Prep HPF POC None None   Clue Cells Wet Prep Whiff POC Negative Whiff    Yeast Wet Prep HPF POC Few    KOH Wet Prep POC     Trichomonas Wet Prep HPF POC none      Assessment & Plan:  A:    IUD check  Vulvovaginal candida  PPD, resolving spontaneously  P:  Prefers pills, rx diflucan 150mg  x 1, may repeat in 3d if needed   F/U Jan for pap&physical   Marge Duncans CNM, [facility] 06/09/2015 4:39 PM

## 2015-07-22 ENCOUNTER — Ambulatory Visit: Payer: PRIVATE HEALTH INSURANCE | Admitting: Family Medicine

## 2015-08-04 ENCOUNTER — Ambulatory Visit: Payer: PRIVATE HEALTH INSURANCE | Admitting: Family Medicine

## 2015-08-25 ENCOUNTER — Other Ambulatory Visit: Payer: Self-pay | Admitting: Women's Health

## 2015-08-28 ENCOUNTER — Encounter: Payer: Self-pay | Admitting: Family Medicine

## 2015-08-28 ENCOUNTER — Ambulatory Visit (INDEPENDENT_AMBULATORY_CARE_PROVIDER_SITE_OTHER): Payer: PRIVATE HEALTH INSURANCE | Admitting: Family Medicine

## 2015-08-28 VITALS — BP 128/74 | HR 82 | Temp 97.9°F | Resp 16 | Ht 63.0 in | Wt 226.0 lb

## 2015-08-28 DIAGNOSIS — E669 Obesity, unspecified: Secondary | ICD-10-CM | POA: Diagnosis not present

## 2015-08-28 DIAGNOSIS — F329 Major depressive disorder, single episode, unspecified: Secondary | ICD-10-CM | POA: Insufficient documentation

## 2015-08-28 DIAGNOSIS — F418 Other specified anxiety disorders: Secondary | ICD-10-CM

## 2015-08-28 DIAGNOSIS — M545 Low back pain, unspecified: Secondary | ICD-10-CM

## 2015-08-28 MED ORDER — VENLAFAXINE HCL ER 37.5 MG PO CP24: ORAL_CAPSULE | ORAL | Status: DC

## 2015-08-28 MED ORDER — ALPRAZOLAM 0.5 MG PO TABS: 0.5000 mg | ORAL_TABLET | Freq: Two times a day (BID) | ORAL | Status: DC | PRN

## 2015-08-28 NOTE — Assessment & Plan Note (Signed)
I think her low back pain is related to her weight. She had a benign examination today.

## 2015-08-28 NOTE — Assessment & Plan Note (Signed)
Start her on Effexor will start with 37.5 mg and increase up to 75 mg. I'm also getting a bridge her with alprazolam and she does have significant anxiety which comes out at work. She understands that this can be a habit forming medication we also discussed the side effects of both medications. I asked her to look into her job about therapy she was reluctant to sign up today for therapy but she may have some benefits that allows for some free sessions with her job. I plan to see her back in interim in a few weeks to see how she is doing with the medication. I think that the emotional eating has led to her significant obesity over time and getting her depression and anxiety under control will help with her weight loss.

## 2015-08-28 NOTE — Patient Instructions (Signed)
Start Effexor take 1 capsule daily for 1 week, then increase to 2 tablets Take the xanax twice a day as prescribed F/u 6 weeks for mood

## 2015-08-28 NOTE — Progress Notes (Signed)
Patient ID: Claire Weber, female   DOB: May 14, 1986, 29 y.o.   MRN: [id number]   Subjective:    Patient ID: Claire Weber, female    DOB: 1986/04/17, 30 y.o.   MRN: [id number]  Patient presents for [facility]; Weight Loss; Anxiety; and BLE Pain  patient here to establish care. She was seen by [facility] medical a few years ago. [facility] Her history and medications were reviewed. She has history of anxiety which she has never really opened up to. This is been going on for many years. More recently that she's had some depressive symptoms along with her anxiety and these are starting to affect her home life as well as her job. She has a 54-month-old child and she really said she was having postpartum blues directly after birth. She is trying to work full time taking care of her daughter and she is also in a relationship with her daughter's father however they do not see eye to eye this time. They do not live together were therefore she has a lot of the burden of pain or tingling on her. She does get some support from her mother. 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 She was on Prozac a very long time ago but this did not help. She was prescribed Lexapro for postpartum blues by her OB/GYN but she never started to take this. She states that she went to a therapist 1 time in the past when she was struggling with her anxiety but she was uninsured therefore cannot follow through. She is also very concerned about her weight she is near her pre-baby weight however her weight causes her much stress she does not like the way she looks and this leads to emotional eating. She asked about diet pills.  She's also had chronic leg fatigue and back  soreness since the pregnancy. She thought it was due to the epidural. She no longer works out she pretty much walks around at her job and that is it. She states that she feels cold when she is trying to get down to the floor to play with her child. She denies any radiating back symptoms denies any tingling or numbness in the extremities.    Review Of Systems:  GEN- denies fatigue, fever, weight loss,weakness, recent illness HEENT- denies eye drainage, change in vision, nasal discharge, CVS- denies chest pain, palpitations RESP- denies SOB, cough, wheeze ABD- denies N/V, change in stools, abd pain GU- denies dysuria, hematuria, dribbling, incontinence MSK- denies joint pain, muscle aches, injury Neuro- denies headache, dizziness, syncope, seizure activity       Objective:    BP 128/74 mmHg  Pulse 82  Temp(Src) 97.9 F (36.6 C) (Oral)  Resp 16  Ht  (1.6 m)  Wt 226 lb (102.513 kg)  BMI 40.04 kg/m2 GEN- NAD, alert and oriented x3 HEENT- PERRL, EOMI, non injected sclera, pink conjunctiva, MMM, oropharynx clear Neck- Supple, no thyromegaly CVS- RRR, no murmur RESP-CTAB ABD-NABS,soft,NT,ND Psych- normal affect and mood, normal and coherent thought process, no SI, well groomed, good eye contact MSK- Spine NT, good ROM, FROM HIPS/KNEES, neg SLR Neuro- normal tone LE, strength equal bilat  LE  EXT- No edema Pulses- Radial  2+        Assessment & Plan:      Problem List Items Addressed This Visit    Obesity - Primary   Depression with anxiety      Note: This dictation was prepared with Dragon dictation along with smaller phrase technology. Any transcriptional errors that result from this process are unintentional.

## 2016-02-16 ENCOUNTER — Encounter: Payer: Self-pay | Admitting: Family Medicine

## 2016-02-16 ENCOUNTER — Ambulatory Visit (INDEPENDENT_AMBULATORY_CARE_PROVIDER_SITE_OTHER): Payer: PRIVATE HEALTH INSURANCE | Admitting: Family Medicine

## 2016-02-16 VITALS — BP 128/64 | HR 72 | Temp 98.2°F | Resp 16 | Ht 63.0 in | Wt 227.0 lb

## 2016-02-16 DIAGNOSIS — F418 Other specified anxiety disorders: Secondary | ICD-10-CM

## 2016-02-16 DIAGNOSIS — Z124 Encounter for screening for malignant neoplasm of cervix: Secondary | ICD-10-CM

## 2016-02-16 DIAGNOSIS — Z113 Encounter for screening for infections with a predominantly sexual mode of transmission: Secondary | ICD-10-CM

## 2016-02-16 DIAGNOSIS — Z0000 Encounter for general adult medical examination without abnormal findings: Secondary | ICD-10-CM | POA: Diagnosis not present

## 2016-02-16 DIAGNOSIS — E669 Obesity, unspecified: Secondary | ICD-10-CM | POA: Diagnosis not present

## 2016-02-16 LAB — WET PREP FOR TRICH, YEAST, CLUE
Clue Cells Wet Prep HPF POC: NONE SEEN
Trich, Wet Prep: NONE SEEN
YEAST WET PREP: NONE SEEN

## 2016-02-16 MED ORDER — NALTREXONE-BUPROPION HCL ER 8-90 MG PO TB12: 2.0000 | ORAL_TABLET | Freq: Two times a day (BID) | ORAL | Status: DC

## 2016-02-16 NOTE — Assessment & Plan Note (Signed)
Will try Contrave as this has wellbutrin in it. She would benefit from some anti-depressant to help with mood Her weight also contributes to her depression symptoms

## 2016-02-16 NOTE — Assessment & Plan Note (Signed)
Discuss healthy eating, meal planning Discussed trial of Contrave

## 2016-02-16 NOTE — Progress Notes (Signed)
Patient ID: Claire Weber, female   DOB: 1986/12/11, 30 y.o.   MRN: [id number]   Subjective:    Patient ID: Claire Weber, female    DOB: 07-12-1986, 30 y.o.   MRN: [id number]  Patient presents for CPE with PAP  aching here for complete physical exam. Her last visit she was established care she is having some difficulties with anxiety and depression she was started on Effexor  But she never took the medication. She did take a few doses of  Alprazolam but this does not help her daily stressors with her being a single parent and her relationship issues She was also supposed to look into therapy as her job provided this resource.Marland Kitchen  He was prescribed Lexapro by her OB/GYN for postpartum depression but she never started the medication. She continues to have issues with her weight as well, has difficulty motiviating herself for weight loss, she is an emotional eater. She does not exercise      Her last Pap smear was in 2014 she is due for a Pap smear she does have IUD in place   her immunizations are up-to-date with the exception of the flu shot which she declines Review Of Systems:  GEN- denies fatigue, fever, weight loss,weakness, recent illness HEENT- denies eye drainage, change in vision, nasal discharge, CVS- denies chest pain, palpitations RESP- denies SOB, cough, wheeze ABD- denies N/V, change in stools, abd pain GU- denies dysuria, hematuria, dribbling, incontinence MSK- denies joint pain, muscle aches, injury Neuro- denies headache, dizziness, syncope, seizure activity       Objective:    BP 128/64 mmHg  Pulse 72  Temp(Src) 98.2 F (36.8 C) (Oral)  Resp 16  Ht  (1.6 m)  Wt 227 lb (102.967 kg)  BMI 40.22 kg/m2  LMP 01/24/2016 (Approximate) GEN- NAD, alert and oriented x3 HEENT- PERRL, EOMI, non injected sclera, pink conjunctiva, MMM, oropharynx clear Neck- Supple, no thyromegaly CVS- RRR, no murmur RESP-CTAB Breast- normal symmetry, no nipple inversion,no nipple drainage,  no nodules or lumps felt Nodes- no axillary nodes ABD-NABS,soft,NT,ND GU- normal external genitalia, vaginal mucosa pink and moist, cervix visualized no growth, no blood form os, minimal thin clear discharge, no CMT, no ovarian masses, uterus normal size, IUD strings visible Psych- very pleasant, normal affect and mood  EXT- No edema Pulses- Radial, DP- 2+        Assessment & Plan:      Problem List Items Addressed This Visit    Obesity    Discuss healthy eating, meal planning Discussed trial of Contrave       Relevant Medications   Naltrexone-Bupropion HCl ER (CONTRAVE) 8-90 MG TB12   Depression with anxiety    Will try Contrave as this has wellbutrin in it. She would benefit from some anti-depressant to help with mood Her weight also contributes to her depression symptoms        Other Visit Diagnoses    Routine general medical examination at a health care facility    -  Primary    PAP Smear done, STD screen. Will get fasting labs this week. Declines flu     Relevant Orders    CBC with Differential/Platelet    Comprehensive metabolic panel    Lipid panel    TSH    Cervical cancer screening        Relevant Orders    PAP, ThinPrep ASCUS Rflx HPV Rflx Type    Screen for STD (sexually transmitted disease)  Relevant Orders    GC/Chlamydia Probe Amp    WET PREP FOR TRICH, YEAST, CLUE (Completed)       Note: This dictation was prepared with Dragon dictation along with smaller phrase technology. Any transcriptional errors that result from this process are unintentional.

## 2016-02-16 NOTE — Patient Instructions (Addendum)
Try the contrave Work on meal planning on weekend, healthy snacks  Get labs done fasting F/U 2 months for weight check

## 2016-02-17 ENCOUNTER — Encounter: Payer: PRIVATE HEALTH INSURANCE | Admitting: Family Medicine

## 2016-02-17 LAB — GC/CHLAMYDIA PROBE AMP
CT Probe RNA: NOT DETECTED
GC PROBE AMP APTIMA: NOT DETECTED

## 2016-02-18 LAB — PAP THINPREP ASCUS RFLX HPV RFLX TYPE

## 2016-02-24 ENCOUNTER — Telehealth: Payer: Self-pay | Admitting: Family Medicine

## 2016-02-24 NOTE — Telephone Encounter (Signed)
Pt saw Dr. Jeanice Lim on 2/21 and was called in Wellbuterin too high [phone] [facility]

## 2016-02-27 ENCOUNTER — Other Ambulatory Visit: Payer: Self-pay | Admitting: Family Medicine

## 2016-02-27 LAB — COMPREHENSIVE METABOLIC PANEL
ALT: 8 U/L (ref 6–29)
AST: 14 U/L (ref 10–30)
Albumin: 4.1 g/dL (ref 3.6–5.1)
Alkaline Phosphatase: 105 U/L (ref 33–115)
BILIRUBIN TOTAL: 1.1 mg/dL (ref 0.2–1.2)
BUN: 9 mg/dL (ref 7–25)
CO2: 25 mmol/L (ref 20–31)
Calcium: 9.2 mg/dL (ref 8.6–10.2)
Chloride: 103 mmol/L (ref 98–110)
Creat: 0.9 mg/dL (ref 0.50–1.10)
GLUCOSE: 89 mg/dL (ref 70–99)
POTASSIUM: 4.1 mmol/L (ref 3.5–5.3)
Sodium: 135 mmol/L (ref 135–146)
Total Protein: 6.8 g/dL (ref 6.1–8.1)

## 2016-02-27 LAB — CBC WITH DIFFERENTIAL/PLATELET
Basophils Absolute: 0 10*3/uL (ref 0.0–0.1)
Basophils Relative: 1 % (ref 0–1)
EOS ABS: 0.1 10*3/uL (ref 0.0–0.7)
EOS PCT: 3 % (ref 0–5)
HCT: 40.8 % (ref 36.0–46.0)
Hemoglobin: 14.1 g/dL (ref 12.0–15.0)
LYMPHS PCT: 31 % (ref 12–46)
Lymphs Abs: 1.3 10*3/uL (ref 0.7–4.0)
MCH: 31.6 pg (ref 26.0–34.0)
MCHC: 34.6 g/dL (ref 30.0–36.0)
MCV: 91.5 fL (ref 78.0–100.0)
MONO ABS: 0.6 10*3/uL (ref 0.1–1.0)
MPV: 11.7 fL (ref 8.6–12.4)
Monocytes Relative: 14 % — ABNORMAL HIGH (ref 3–12)
Neutro Abs: 2.1 10*3/uL (ref 1.7–7.7)
Neutrophils Relative %: 51 % (ref 43–77)
Platelets: 247 10*3/uL (ref 150–400)
RBC: 4.46 MIL/uL (ref 3.87–5.11)
RDW: 12.8 % (ref 11.5–15.5)
WBC: 4.1 10*3/uL (ref 4.0–10.5)

## 2016-02-27 LAB — TSH: TSH: 0.39 m[IU]/L — AB

## 2016-02-27 LAB — LIPID PANEL
CHOL/HDL RATIO: 2.9 ratio (ref <=5.0)
Cholesterol: 114 mg/dL — ABNORMAL LOW (ref 125–200)
HDL: 40 mg/dL — AB (ref >=46)
LDL CALC: 66 mg/dL (ref <130)
Triglycerides: 38 mg/dL (ref <150)
VLDL: 8 mg/dL (ref <30)

## 2016-02-29 ENCOUNTER — Other Ambulatory Visit: Payer: Self-pay | Admitting: *Deleted

## 2016-02-29 DIAGNOSIS — R7989 Other specified abnormal findings of blood chemistry: Secondary | ICD-10-CM

## 2016-02-29 LAB — T4, FREE: Free T4: 1 ng/dL (ref 0.8–1.8)

## 2016-02-29 LAB — T3, FREE: T3, Free: 3 pg/mL (ref 2.3–4.2)

## 2016-02-29 NOTE — Telephone Encounter (Signed)
Call received from patient.   States that Contrave is $90.00. She cannot afford this medication.   MD please advise.

## 2016-03-01 MED ORDER — BUPROPION HCL 100 MG PO TABS: 100.0000 mg | ORAL_TABLET | Freq: Two times a day (BID) | ORAL | Status: DC

## 2016-03-01 NOTE — Telephone Encounter (Signed)
Okay advise we will try the wellbutrin by itself and not the Boeingcontrave  Start Wellbutrin 100mg  BID , when she first gets she can take 1 in morning for 3 days, then go to BID

## 2016-03-01 NOTE — Telephone Encounter (Signed)
Call placed to patient and patient made aware.   Prescription sent to pharmacy.  

## 2016-04-19 ENCOUNTER — Ambulatory Visit: Payer: PRIVATE HEALTH INSURANCE | Admitting: Family Medicine

## 2016-05-20 ENCOUNTER — Ambulatory Visit (INDEPENDENT_AMBULATORY_CARE_PROVIDER_SITE_OTHER): Payer: PRIVATE HEALTH INSURANCE | Admitting: Family Medicine

## 2016-05-20 ENCOUNTER — Encounter: Payer: Self-pay | Admitting: Family Medicine

## 2016-05-20 VITALS — BP 118/78 | HR 68 | Temp 98.0°F | Resp 14 | Ht 63.0 in | Wt 223.0 lb

## 2016-05-20 DIAGNOSIS — F4541 Pain disorder exclusively related to psychological factors: Secondary | ICD-10-CM | POA: Diagnosis not present

## 2016-05-20 DIAGNOSIS — F418 Other specified anxiety disorders: Secondary | ICD-10-CM

## 2016-05-20 DIAGNOSIS — R7989 Other specified abnormal findings of blood chemistry: Secondary | ICD-10-CM

## 2016-05-20 LAB — T3, FREE: T3 FREE: 3.2 pg/mL (ref 2.3–4.2)

## 2016-05-20 LAB — TSH: TSH: 0.36 mIU/L — ABNORMAL LOW

## 2016-05-20 LAB — T4, FREE: FREE T4: 1 ng/dL (ref 0.8–1.8)

## 2016-05-20 MED ORDER — ALPRAZOLAM 0.5 MG PO TABS: 0.5000 mg | ORAL_TABLET | Freq: Two times a day (BID) | ORAL | Status: DC | PRN

## 2016-05-20 MED ORDER — BUPROPION HCL 100 MG PO TABS: 100.0000 mg | ORAL_TABLET | Freq: Two times a day (BID) | ORAL | Status: DC

## 2016-05-20 NOTE — Progress Notes (Signed)
Patient ID: Claire Weber, female   DOB: 11/10/1986, 30 y.o.   MRN: [id number]   Subjective:    Patient ID: Claire ConchLisa R Weber, female    DOB: 11/10/1986, 30 y.o.   MRN: [id number]  Patient presents for Medication Review/ Refill; Motion Sickness; and Frequent HA  Patient here for follow-up after last visit she was having difficulty with depression and anxiety she had not started the medication prescribed by her GYN. We decided to try her on Contrave for weight loss and depression since it had Wellbutrin added however she cannot afford the medications that she was started on Wellbutrin 100 mg twice a day however she is not taking this. Her weight is only down 4 pounds since her last visit.  She also complains of headaches, persistant stress, her childs father made fun of her after finding the xanax in her purse so she did not want to get the wellbutrin, but feels depressed and overwhelmed. Does not have money or time to go to therapy.  She has been getting motion sickeness when she drives to work events that the trip is > 1 hour. Admits to skipping breafast and often taking aleve when she drives and seems fearful before she even starts driving. She eats poor otherwise, rarely cooks, stress eats junk food    Review Of Systems:  GEN- +fatigue, fever, weight loss,weakness, recent illness HEENT- denies eye drainage, change in vision, nasal discharge, CVS- denies chest pain, palpitations RESP- denies SOB, cough, wheeze ABD- denies N/V, change in stools, abd pain GU- denies dysuria, hematuria, dribbling, incontinence MSK- denies joint pain, muscle aches, injury Neuro- + headache, dizziness, syncope, seizure activity       Objective:    BP 118/78 mmHg  Pulse 68  Temp(Src) 98 F (36.7 C) (Oral)  Resp 14  Ht 5\' 3"  (1.6 m)  Wt 223 lb (101.152 kg)  BMI 39.51 kg/m2  LMP 05/11/2016 GEN- NAD, alert and oriented x3 HEENT- PERRL, EOMI, non injected sclera, pink conjunctiva, MMM, oropharynx clear Neck-  Supple, no thyromegaly CVS- RRR, no murmur RESP-CTAB Neuro-CNII-XII intact no deficits Psych- depressed affect, seems very overwhelmed, no SI/HI, no hallucinations  EXT- No edema Pulses- Radial 2+        Assessment & Plan:      Problem List Items Addressed This Visit    Stress headaches    Recommend she discontinue BC powder use per above      Depression with anxiety - Primary    I think all of her somatic symptoms are due to the untreated mood disorder. She is very overwhelmed and father of baby is not supportive to her, though per report cares for child well. I recommend she try the Wellbutrin, it is weight neutral as well.  She is using Xanax sparingly but not the best treatment for her mood disorder alone Also discussed effects of skipping meals, poor sleep, stress eating causing headaches, nausea, fatigue       Other Visit Diagnoses    Abnormal thyroid stimulating hormone (TSH) level           Note: This dictation was prepared with Dragon dictation along with smaller phrase technology. Any transcriptional errors that result from this process are unintentional.

## 2016-05-20 NOTE — Assessment & Plan Note (Signed)
Recommend she discontinue BC powder use per above

## 2016-05-20 NOTE — Patient Instructions (Addendum)
Take the wellbutrin 1 tablet in the morning for 3 days, then 1 tablet twice a day  Talk to your HR about therapy for free  We will call with thyroid levels  Multivitamin one a day  F/U 3 months

## 2016-05-20 NOTE — Assessment & Plan Note (Signed)
I think all of her somatic symptoms are due to the untreated mood disorder. She is very overwhelmed and father of baby is not supportive to her, though per report cares for child well. I recommend she try the Wellbutrin, it is weight neutral as well.  She is using Xanax sparingly but not the best treatment for her mood disorder alone Also discussed effects of skipping meals, poor sleep, stress eating causing headaches, nausea, fatigue

## 2016-05-24 ENCOUNTER — Ambulatory Visit: Payer: PRIVATE HEALTH INSURANCE | Admitting: Family Medicine

## 2016-05-26 ENCOUNTER — Other Ambulatory Visit: Payer: Self-pay | Admitting: *Deleted

## 2016-05-26 DIAGNOSIS — E0590 Thyrotoxicosis, unspecified without thyrotoxic crisis or storm: Secondary | ICD-10-CM

## 2016-05-27 ENCOUNTER — Telehealth: Payer: Self-pay | Admitting: Family Medicine

## 2016-05-27 NOTE — Telephone Encounter (Signed)
Advise other medications will have much more SE with regards to Libido, wellbutrin is not one that tends to cause this issue so I do not recommend changing.

## 2016-05-27 NOTE — Telephone Encounter (Signed)
Call placed to patient. LMTRC.   (hyperactive thyroid- ordered US and uptake scan to check for nodules)

## 2016-05-27 NOTE — Telephone Encounter (Signed)
Pt spoke with a nurse yesterday about her lab results and she is unclear about what they spoke about. She would like someone to call her to go over the results again.  [phone]

## 2016-05-27 NOTE — Telephone Encounter (Signed)
Patient returned call and discussed results at length.   Patient also states that she is concerned about the effects Wellbutrin can have on her libido. Reports that she is feeling great on the medication, but has no drive.   Requested MD to advise if another medication would have less change in her libido.

## 2016-05-27 NOTE — Telephone Encounter (Signed)
Patient returned call.   While discussing lab results, phone call disconnected.   Call placed to patient. LMTRC.

## 2016-05-27 NOTE — Telephone Encounter (Signed)
Call placed to patient and patient made aware.   Advised to give medication a couple of days.   If she has Sx of decreased libido, then contact office at that time.

## 2016-06-03 ENCOUNTER — Other Ambulatory Visit (HOSPITAL_COMMUNITY): Payer: Self-pay | Admitting: *Deleted

## 2016-06-06 ENCOUNTER — Encounter (HOSPITAL_COMMUNITY)
Admission: RE | Admit: 2016-06-06 | Discharge: 2016-06-06 | Disposition: A | Discharge status: Home / Self Care | Payer: Managed Care, Other (non HMO) | Source: Ambulatory Visit | Attending: Family Medicine | Admitting: Family Medicine

## 2016-06-06 DIAGNOSIS — E0590 Thyrotoxicosis, unspecified without thyrotoxic crisis or storm: Secondary | ICD-10-CM | POA: Insufficient documentation

## 2016-06-06 MED ORDER — SODIUM IODIDE I 131 CAPSULE
15.0000 | Freq: Once | INTRAVENOUS | Status: AC | PRN
  Administered 2016-06-06: 15 via ORAL

## 2016-06-07 ENCOUNTER — Encounter (HOSPITAL_COMMUNITY): Payer: Self-pay

## 2016-06-07 ENCOUNTER — Ambulatory Visit (HOSPITAL_COMMUNITY)
Admission: RE | Admit: 2016-06-07 | Discharge: 2016-06-07 | Disposition: A | Discharge status: Home / Self Care | Payer: PRIVATE HEALTH INSURANCE | Source: Ambulatory Visit | Attending: Family Medicine | Admitting: Family Medicine

## 2016-06-07 ENCOUNTER — Encounter (HOSPITAL_COMMUNITY)
Admission: RE | Admit: 2016-06-07 | Discharge: 2016-06-07 | Disposition: A | Discharge status: Home / Self Care | Payer: Managed Care, Other (non HMO) | Source: Ambulatory Visit | Attending: Family Medicine | Admitting: Family Medicine

## 2016-06-07 DIAGNOSIS — E0590 Thyrotoxicosis, unspecified without thyrotoxic crisis or storm: Secondary | ICD-10-CM

## 2016-06-07 MED ORDER — SODIUM PERTECHNETATE TC 99M INJECTION
10.0000 | Freq: Once | INTRAVENOUS | Status: AC | PRN
  Administered 2016-06-07: 10 via INTRAVENOUS

## 2016-06-08 ENCOUNTER — Telehealth: Payer: Self-pay | Admitting: Family Medicine

## 2016-06-08 NOTE — Telephone Encounter (Signed)
Pt rec'd imaging results via mychart and she would like for someone to go over the results with her just to make sure she understands.  [phone]

## 2016-06-08 NOTE — Telephone Encounter (Signed)
Call placed to patient.   Discussed results with patient.   Patient is concerned about co-pay for scan ($900.00).   Advised to contact insurance with concerns.

## 2016-09-09 ENCOUNTER — Ambulatory Visit: Payer: PRIVATE HEALTH INSURANCE | Admitting: Family Medicine

## 2016-10-07 ENCOUNTER — Encounter: Payer: Self-pay | Admitting: Family Medicine

## 2016-10-11 ENCOUNTER — Ambulatory Visit: Payer: PRIVATE HEALTH INSURANCE | Admitting: Family Medicine

## 2016-10-26 ENCOUNTER — Ambulatory Visit: Payer: PRIVATE HEALTH INSURANCE | Admitting: Family Medicine

## 2016-10-26 ENCOUNTER — Encounter: Payer: Self-pay | Admitting: Women's Health

## 2016-10-26 ENCOUNTER — Ambulatory Visit (INDEPENDENT_AMBULATORY_CARE_PROVIDER_SITE_OTHER): Payer: Managed Care, Other (non HMO) | Admitting: Women's Health

## 2016-10-26 VITALS — BP 100/70 | HR 78 | Wt 218.0 lb

## 2016-10-26 DIAGNOSIS — N760 Acute vaginitis: Secondary | ICD-10-CM

## 2016-10-26 DIAGNOSIS — B9689 Other specified bacterial agents as the cause of diseases classified elsewhere: Secondary | ICD-10-CM | POA: Diagnosis not present

## 2016-10-26 DIAGNOSIS — N898 Other specified noninflammatory disorders of vagina: Secondary | ICD-10-CM | POA: Diagnosis not present

## 2016-10-26 DIAGNOSIS — B372 Candidiasis of skin and nail: Secondary | ICD-10-CM | POA: Diagnosis not present

## 2016-10-26 LAB — POCT WET PREP (WET MOUNT)
CLUE CELLS WET PREP WHIFF POC: POSITIVE
Trichomonas Wet Prep HPF POC: ABSENT

## 2016-10-26 MED ORDER — METRONIDAZOLE 500 MG PO TABS: 500.0000 mg | ORAL_TABLET | Freq: Two times a day (BID) | ORAL | 0 refills | Status: DC

## 2016-10-26 NOTE — Progress Notes (Signed)
   [facility] Visit  Patient name: Claire ConchLisa R Weber MRN [id number]  Date of birth: 02/11/1986  CC & HPI:  Claire Weber is a 30 y.o. 582P1011 African American female presenting today for vulvar itching, odor, and d/c x few weeks. Also itching between breasts.  Patient's last menstrual period was 10/22/2016. The current method of family planning is IUD. Last pap 01/2016  Pertinent History Reviewed:  Medical & Surgical Hx:   Past medical, surgical, family, and social history reviewed in electronic medical record Medications: Reviewed & Updated - see associated section Allergies: Reviewed in electronic medical record  Objective Findings:  Vitals: BP 100/70   Pulse 78   Wt 218 lb (98.9 kg)   LMP 10/22/2016   BMI 38.62 kg/m  Body mass index is 38.62 kg/m.  Physical Examination: General appearance - alert, well appearing, and in no distress Breasts - large breasted, no apparent yeast, painted w/ gentian violet in areas pt pointed to w/ itching Pelvic - IUD strings visible, menstrual blood, slightly maldorous, no thick chunks  Results for orders placed or performed in visit on 10/26/16 (from the past 24 hour(s))  POCT Wet Prep Mellody Drown(Wet [place])   Collection Time: 10/26/16  4:55 PM  Result Value Ref Range   Source Wet Prep POC vaginal    WBC, Wet Prep HPF POC none    Bacteria Wet Prep HPF POC None None, Few, Too numerous to count   BACTERIA WET PREP MORPHOLOGY POC     Clue Cells Wet Prep HPF POC Few (A) None, Too numerous to count   Clue Cells Wet Prep Whiff POC Positive Whiff    Yeast Wet Prep HPF POC None    KOH Wet Prep POC     Trichomonas Wet Prep HPF POC Absent Absent     Assessment & Plan:  A:   Likely skin candida b/w breasts  BV P:  Painted b/w breasts w/ gentian violet  Rx metronidazole 500mg  BID x 7d for BV, no sex or etoh while taking   Return for prn. (has paps w/ pcp)  Marge DuncansBooker, Serah Nicoletti Randall CNM, [facility]NP-BC 10/26/2016 4:57 PM

## 2017-02-11 ENCOUNTER — Other Ambulatory Visit: Payer: Self-pay | Admitting: Family Medicine

## 2017-02-13 NOTE — Telephone Encounter (Signed)
Ok to refill 

## 2017-02-13 NOTE — Telephone Encounter (Signed)
Okay to give 1 refill 

## 2017-02-14 NOTE — Telephone Encounter (Signed)
Medication called to pharmacy. 

## 2017-09-15 IMAGING — US US THYROID
1 series · 14 of 25 positions shown · non-contrast
Comparison: Nuclear medicine study 06/07/2016

CLINICAL DATA: 29-year-old female with a history of hyperthyroidism

EXAM:
THYROID ULTRASOUND
TECHNIQUE: Ultrasound examination of the thyroid gland and adjacent soft
tissues was performed.

[Series 1: us thyroid · 0.06mm/px · 53 acquisitions, 14 frames shown]
[im 1/53]
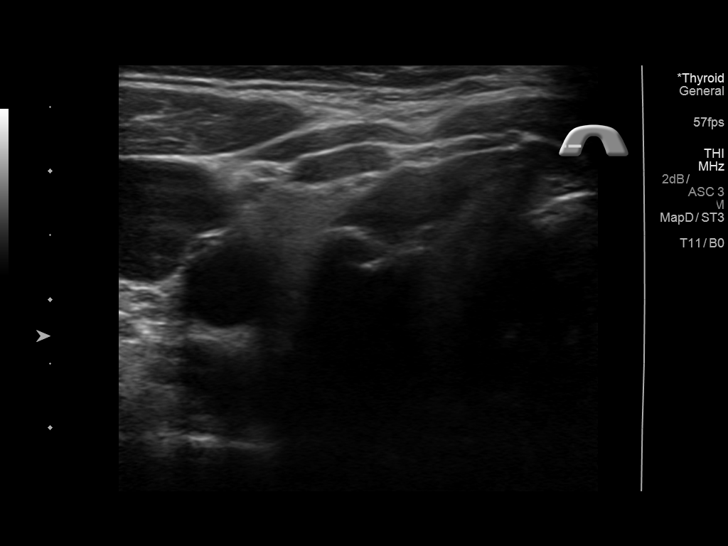
[im 5/53]
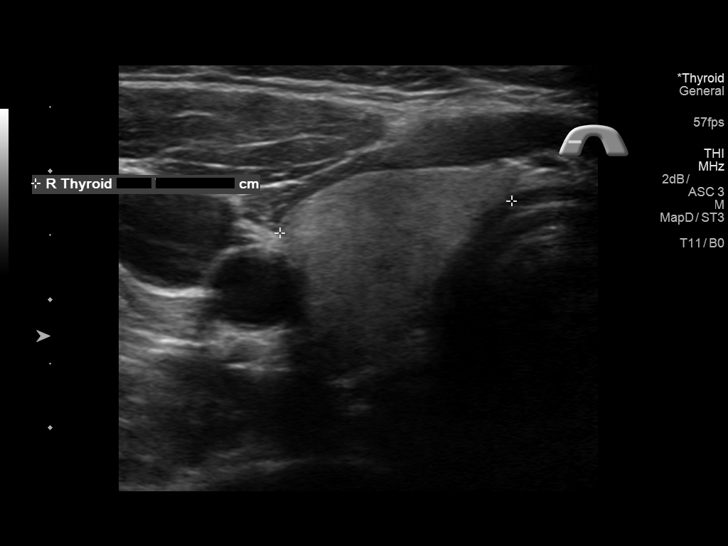
[im 9/53]
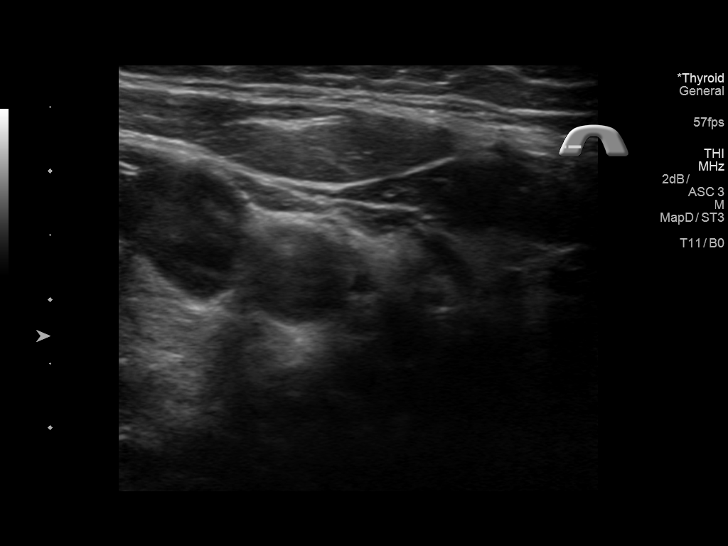
[im 14/53]
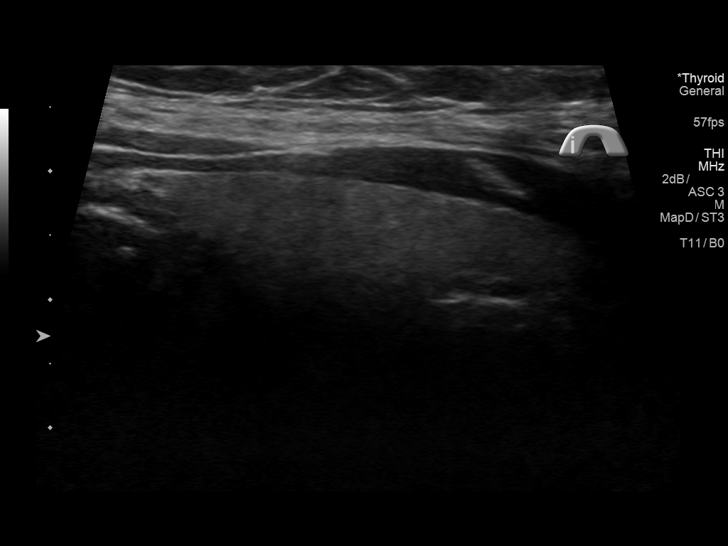
[im 18/53]
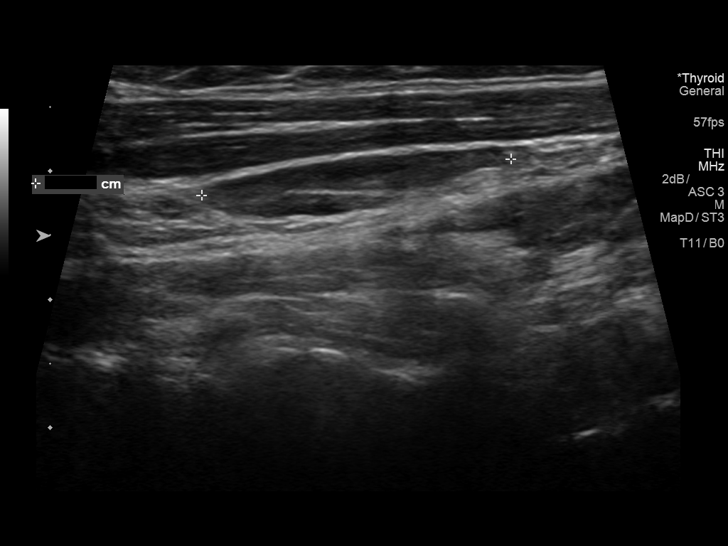
[im 20/53]
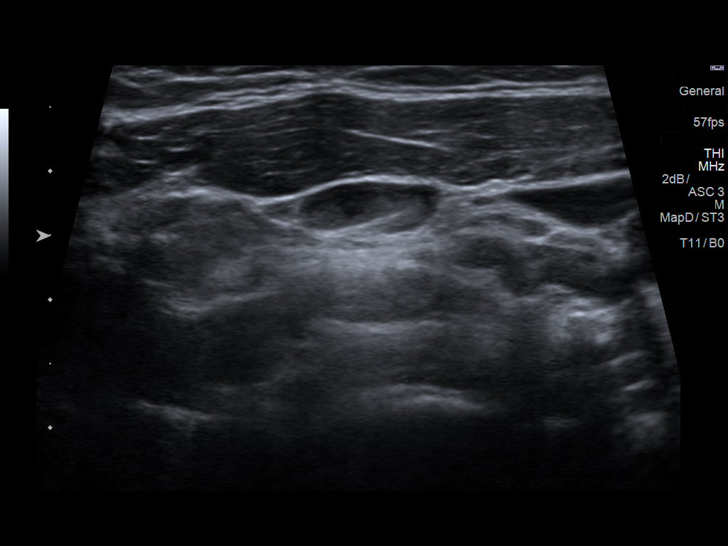
[im 24/53]
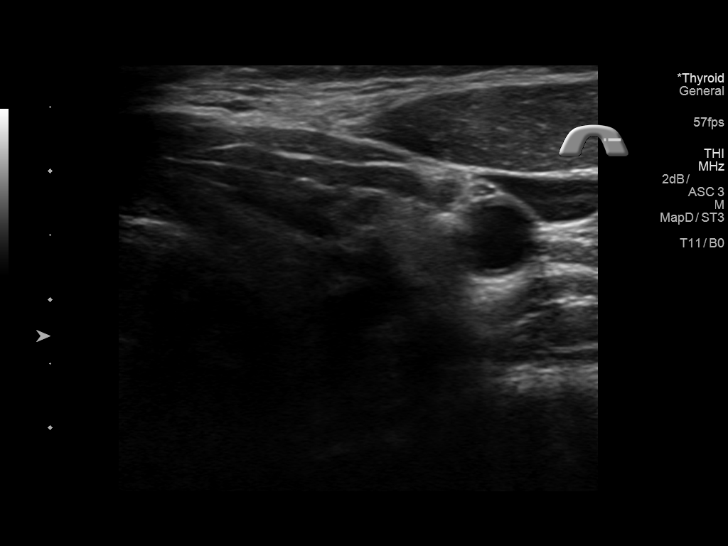
[im 29/53]
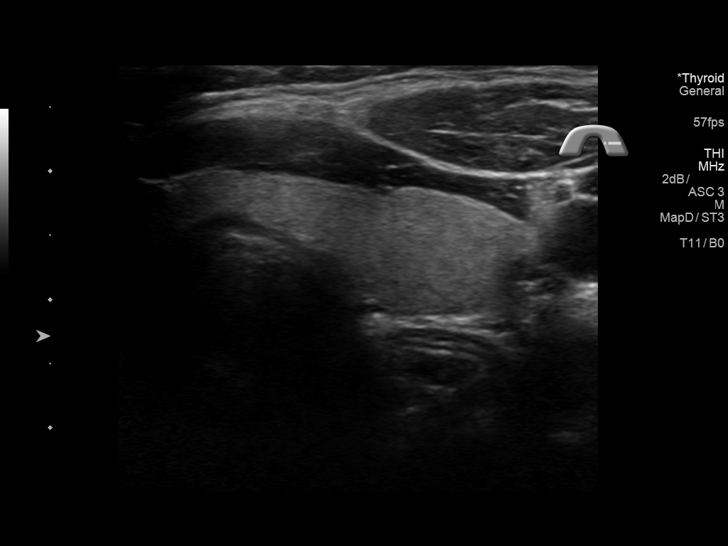
[im 33/53]
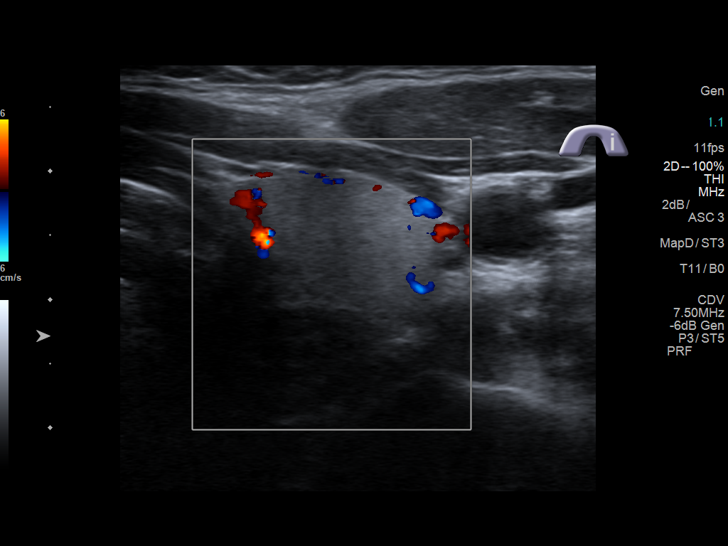
[im 35/53]
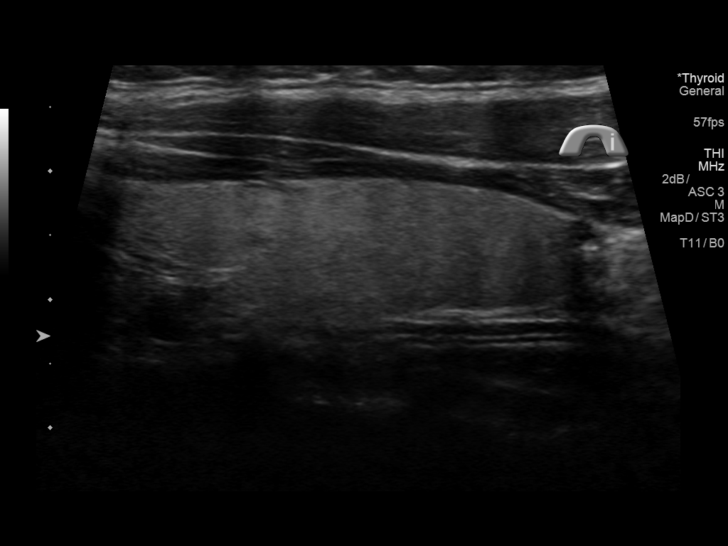
[im 40/53]
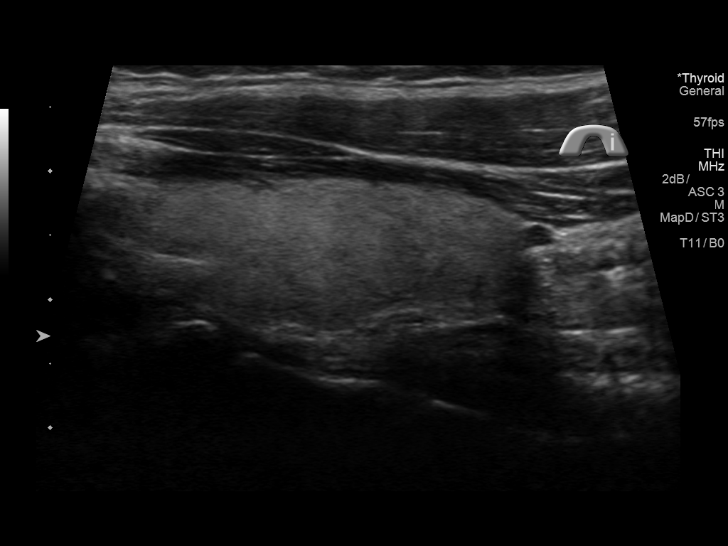
[im 44/53]
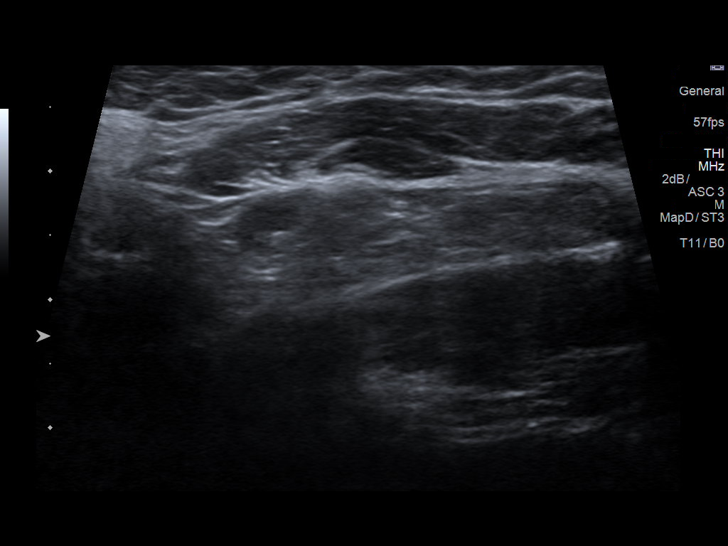
[im 48/53]
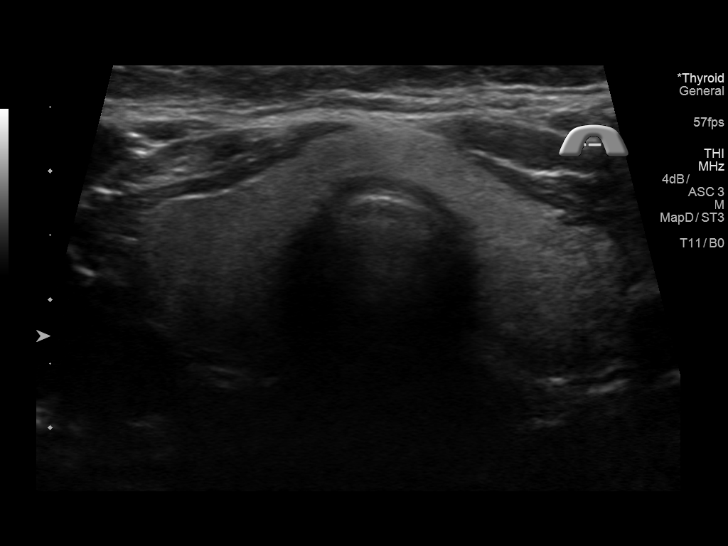
[im 53/53]
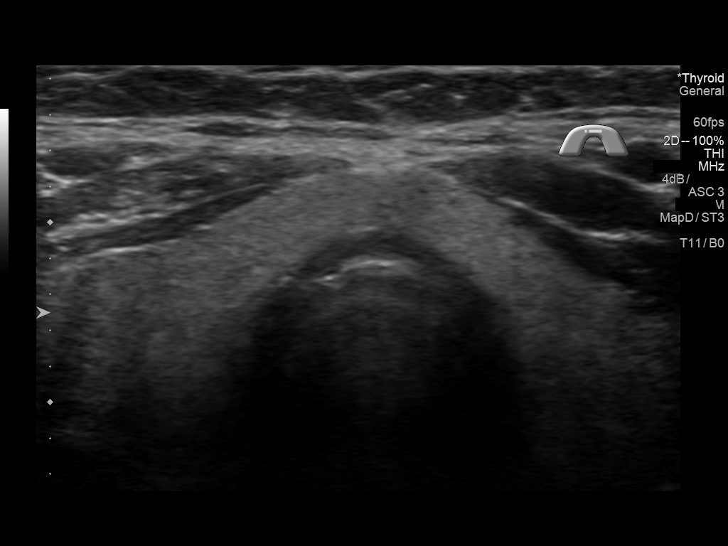

[14 of 25 positions shown; findings below may reference images not displayed]

FINDINGS: Right thyroid lobe

Measurements: 4.5 cm x 1.5 cm x 1.8 cm. Relatively homogeneous
appearance of right thyroid.

Right mid nodule #1 measures 0.4 cm x 0.3 cm x 0.4 cm Nearly
completely solid composition (2), with hypoechoic echogenicity (2),
wider than tall shape (0), smooth margin (0), with no internal
echogenic foci (0). Total ACR points 4 for TI-RADS level of TR 4.

Left thyroid lobe

Measurements: 3.7 cm x 1.3 cm x 1.7 cm.  No nodules visualized.

Isthmus

Thickness: 0.4 cm.  No nodules visualized.

Lymphadenopathy

None visualized.
IMPRESSION: Relatively unremarkable appearance of the thyroid with small
right-sided nodule not requiring surveillance or biopsy, as
designated by the newly established ACR TI-RADS criteria.

Recommendations follow those established by the new ACR TI-RADS
criteria ([HOSPITAL] 9671;[DATE]).

## 2018-04-20 ENCOUNTER — Ambulatory Visit: Payer: Managed Care, Other (non HMO) | Admitting: Family Medicine

## 2018-05-22 ENCOUNTER — Telehealth: Payer: Self-pay | Admitting: Women's Health

## 2018-05-22 NOTE — Telephone Encounter (Signed)
LMOVM that Lilletta is good for 5 years.

## 2018-05-22 NOTE — Telephone Encounter (Signed)
Patient called stating that she has the Liletta IUD placed in and she was told when she first had it inserted that I could change from 3 years to 5 years. Pt would like to know if it is 5 years because she is 3 years this month an d need to replace if it is time. Please contact pt

## 2018-06-08 ENCOUNTER — Ambulatory Visit: Payer: Managed Care, Other (non HMO) | Admitting: Family Medicine

## 2018-08-16 DIAGNOSIS — J069 Acute upper respiratory infection, unspecified: Secondary | ICD-10-CM | POA: Diagnosis not present

## 2018-08-16 DIAGNOSIS — R509 Fever, unspecified: Secondary | ICD-10-CM | POA: Diagnosis not present

## 2018-08-16 DIAGNOSIS — J029 Acute pharyngitis, unspecified: Secondary | ICD-10-CM | POA: Diagnosis not present

## 2018-08-16 DIAGNOSIS — H1032 Unspecified acute conjunctivitis, left eye: Secondary | ICD-10-CM | POA: Diagnosis not present

## 2018-08-17 ENCOUNTER — Ambulatory Visit: Payer: Self-pay | Admitting: Family Medicine

## 2018-08-20 ENCOUNTER — Ambulatory Visit: Payer: Medicaid Other | Admitting: Family Medicine

## 2018-08-21 ENCOUNTER — Encounter: Payer: Self-pay | Admitting: Family Medicine

## 2018-09-24 DIAGNOSIS — Z01419 Encounter for gynecological examination (general) (routine) without abnormal findings: Secondary | ICD-10-CM | POA: Diagnosis not present

## 2018-09-24 DIAGNOSIS — N898 Other specified noninflammatory disorders of vagina: Secondary | ICD-10-CM | POA: Diagnosis not present

## 2018-09-24 DIAGNOSIS — Z6841 Body Mass Index (BMI) 40.0 and over, adult: Secondary | ICD-10-CM | POA: Diagnosis not present

## 2018-09-24 DIAGNOSIS — Z1151 Encounter for screening for human papillomavirus (HPV): Secondary | ICD-10-CM | POA: Diagnosis not present

## 2018-09-26 ENCOUNTER — Telehealth: Payer: Self-pay | Admitting: Family Medicine

## 2018-09-26 NOTE — Telephone Encounter (Signed)
Patient has not been seen in >2 years. Will need OV prior to refill.

## 2018-09-26 NOTE — Telephone Encounter (Signed)
Refill on xanax to [facility] [place]  °

## 2018-10-01 DIAGNOSIS — F331 Major depressive disorder, recurrent, moderate: Secondary | ICD-10-CM | POA: Diagnosis not present

## 2018-10-08 DIAGNOSIS — F331 Major depressive disorder, recurrent, moderate: Secondary | ICD-10-CM | POA: Diagnosis not present

## 2018-10-18 DIAGNOSIS — R87610 Atypical squamous cells of undetermined significance on cytologic smear of cervix (ASC-US): Secondary | ICD-10-CM | POA: Diagnosis not present

## 2018-10-18 DIAGNOSIS — N870 Mild cervical dysplasia: Secondary | ICD-10-CM | POA: Diagnosis not present

## 2018-10-29 DIAGNOSIS — N870 Mild cervical dysplasia: Secondary | ICD-10-CM | POA: Diagnosis not present

## 2018-10-29 DIAGNOSIS — T8332XA Displacement of intrauterine contraceptive device, initial encounter: Secondary | ICD-10-CM | POA: Diagnosis not present

## 2018-11-08 DIAGNOSIS — T8332XA Displacement of intrauterine contraceptive device, initial encounter: Secondary | ICD-10-CM | POA: Diagnosis not present

## 2018-11-08 DIAGNOSIS — Z30430 Encounter for insertion of intrauterine contraceptive device: Secondary | ICD-10-CM | POA: Diagnosis not present

## 2018-12-12 ENCOUNTER — Encounter (INDEPENDENT_AMBULATORY_CARE_PROVIDER_SITE_OTHER): Payer: Medicaid Other

## 2018-12-12 DIAGNOSIS — Z30431 Encounter for routine checking of intrauterine contraceptive device: Secondary | ICD-10-CM | POA: Diagnosis not present

## 2019-01-09 ENCOUNTER — Ambulatory Visit (INDEPENDENT_AMBULATORY_CARE_PROVIDER_SITE_OTHER): Payer: BLUE CROSS/BLUE SHIELD | Admitting: Family Medicine

## 2019-01-09 ENCOUNTER — Other Ambulatory Visit: Payer: Self-pay

## 2019-01-09 ENCOUNTER — Encounter (INDEPENDENT_AMBULATORY_CARE_PROVIDER_SITE_OTHER): Payer: Self-pay | Admitting: Family Medicine

## 2019-01-09 ENCOUNTER — Encounter: Payer: Self-pay | Admitting: Family Medicine

## 2019-01-09 VITALS — BP 122/79 | HR 62 | Temp 97.8°F | Ht 63.0 in | Wt 235.0 lb

## 2019-01-09 VITALS — BP 120/64 | HR 80 | Temp 98.1°F | Resp 14 | Ht 63.0 in | Wt 241.0 lb

## 2019-01-09 DIAGNOSIS — F3289 Other specified depressive episodes: Secondary | ICD-10-CM

## 2019-01-09 DIAGNOSIS — R0602 Shortness of breath: Secondary | ICD-10-CM | POA: Diagnosis not present

## 2019-01-09 DIAGNOSIS — R7989 Other specified abnormal findings of blood chemistry: Secondary | ICD-10-CM | POA: Diagnosis not present

## 2019-01-09 DIAGNOSIS — F5104 Psychophysiologic insomnia: Secondary | ICD-10-CM

## 2019-01-09 DIAGNOSIS — F331 Major depressive disorder, recurrent, moderate: Secondary | ICD-10-CM

## 2019-01-09 DIAGNOSIS — R5383 Other fatigue: Secondary | ICD-10-CM | POA: Diagnosis not present

## 2019-01-09 DIAGNOSIS — E639 Nutritional deficiency, unspecified: Secondary | ICD-10-CM | POA: Diagnosis not present

## 2019-01-09 DIAGNOSIS — Z9189 Other specified personal risk factors, not elsewhere classified: Secondary | ICD-10-CM

## 2019-01-09 DIAGNOSIS — Z1331 Encounter for screening for depression: Secondary | ICD-10-CM | POA: Diagnosis not present

## 2019-01-09 DIAGNOSIS — E559 Vitamin D deficiency, unspecified: Secondary | ICD-10-CM | POA: Diagnosis not present

## 2019-01-09 DIAGNOSIS — Z6841 Body Mass Index (BMI) 40.0 and over, adult: Secondary | ICD-10-CM

## 2019-01-09 DIAGNOSIS — Z0289 Encounter for other administrative examinations: Secondary | ICD-10-CM

## 2019-01-09 DIAGNOSIS — E66813 Obesity, class 3: Secondary | ICD-10-CM

## 2019-01-09 MED ORDER — ALPRAZOLAM 0.5 MG PO TABS: 0.5000 mg | ORAL_TABLET | Freq: Two times a day (BID) | ORAL | 1 refills | Status: DC | PRN

## 2019-01-09 MED ORDER — ESCITALOPRAM OXALATE 10 MG PO TABS: 10.0000 mg | ORAL_TABLET | Freq: Every day | ORAL | 3 refills | Status: DC

## 2019-01-09 NOTE — Patient Instructions (Signed)
Decrease zoloft to 1/2 tablet for 3 days, then stop Start lexapro 16m at bedtime Use Xanax as needed to help your sleep and for nerves  F/U 4 weeks

## 2019-01-09 NOTE — Progress Notes (Signed)
   Subjective:    Patient ID: Claire Weber, female    DOB: 02/11/1986, 33 y.o.   MRN: [id number]  Patient presents for Medication Review/ Refills (started Zoloft for pre menstrual depression- starting taking every day, but now has frequent HA)  Pt seen at a weight management this AM. She is restablishing her primary at my office. Last visit in June 2017    The week before her period starts getting very depressed. She started zoloft 2 months ago by her GYN- Dr. Cherly Hensen, but continues to have stressors, poor sleep, chronic fatigue and she cant lose weight. She has a 11 year old daughter-father not involved, but she is in a new relationship. She admits her libido has also decreaesd and is concerned this will affect her relationship. She has history of headaches but they have worsened since she started the zoloft.    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 Has family history of brain anuersym       Now has Mirena IUD   In the past was on a few other medication, xanax did help her sleep so she would like to resume that along with another anti-depressant in place of zoloft  Note she had multiple labs drawn this AM for her fatigue and obesity that are pending             Review Of Systems:  GEN- + fatigue, denies  fever, weight loss,weakness, recent illness HEENT- denies eye drainage, change in vision, nasal discharge, CVS- denies chest pain, palpitations RESP- denies SOB, cough, wheeze ABD- denies N/V, change in stools, abd pain GU- denies dysuria, hematuria, dribbling, incontinence MSK- denies joint pain, muscle aches, injury Neuro- denies headache, dizziness, syncope, seizure activity       Objective:    BP 120/64   Pulse 80   Temp 98.1 F (36.7 C) (Oral)   Resp 14   Ht 5\' 3"  (1.6 m)   Wt 241 lb (109.3 kg)   SpO2 98%   BMI 42.69 kg/m  GEN- NAD, alert and oriented x3 HEENT- PERRL, EOMI, non injected  sclera, pink conjunctiva, MMM, oropharynx clear Neck- Supple, no thyromegaly CVS- RRR, no murmur RESP-CTAB ABD-NABS,soft,NT,ND Psych- normal affect and mood , no SI, PHQ 9 score 17 EXT- No edema Pulses- Radial 2+        Assessment & Plan:      Problem List Items Addressed This Visit      Unprioritized   Chronic insomnia   MDD (major depressive disorder) - Primary    Decrease zoloft to 25mg  at bedtime for 3 days, then stop, start lexapro 10mg  at bedtime Bridge with xanax 0.5mg  BID prn Will review the labs from the weight loss physician Recheck her in 4-6 weeks I think getting her mood and healthy eating on track will improve many of her symptoms including libido      Relevant Medications   escitalopram (LEXAPRO) 10 MG tablet   ALPRAZolam (XANAX) 0.5 MG tablet      Note: This dictation was prepared with Dragon dictation along with smaller phrase technology. Any transcriptional errors that result from this process are unintentional.

## 2019-01-10 ENCOUNTER — Encounter: Payer: Self-pay | Admitting: Family Medicine

## 2019-01-10 LAB — LIPID PANEL
CHOLESTEROL TOTAL: 121 mg/dL (ref 100–199)
Chol/HDL Ratio: 2.7 ratio (ref 0.0–4.4)
HDL: 45 mg/dL (ref >39)
LDL Calculated: 68 mg/dL (ref 0–99)
Triglycerides: 41 mg/dL (ref 0–149)
VLDL Cholesterol Cal: 8 mg/dL (ref 5–40)

## 2019-01-10 LAB — T4, FREE: FREE T4: 1.01 ng/dL (ref 0.82–1.77)

## 2019-01-10 LAB — CBC WITH DIFFERENTIAL/PLATELET
BASOS: 1 %
Basophils Absolute: 0 10*3/uL (ref 0.0–0.2)
EOS (ABSOLUTE): 0.1 10*3/uL (ref 0.0–0.4)
EOS: 2 %
HEMATOCRIT: 38.8 % (ref 34.0–46.6)
Hemoglobin: 14.1 g/dL (ref 11.1–15.9)
Immature Grans (Abs): 0 10*3/uL (ref 0.0–0.1)
Immature Granulocytes: 0 %
LYMPHS ABS: 2.1 10*3/uL (ref 0.7–3.1)
Lymphs: 39 %
MCH: 33.3 pg — AB (ref 26.6–33.0)
MCHC: 36.3 g/dL — AB (ref 31.5–35.7)
MCV: 92 fL (ref 79–97)
Monocytes Absolute: 0.3 10*3/uL (ref 0.1–0.9)
Monocytes: 6 %
Neutrophils Absolute: 2.9 10*3/uL (ref 1.4–7.0)
Neutrophils: 52 %
PLATELETS: 231 10*3/uL (ref 150–450)
RBC: 4.23 x10E6/uL (ref 3.77–5.28)
RDW: 12.1 % (ref 11.7–15.4)
WBC: 5.5 10*3/uL (ref 3.4–10.8)

## 2019-01-10 LAB — COMPREHENSIVE METABOLIC PANEL
A/G RATIO: 1.5 (ref 1.2–2.2)
ALK PHOS: 104 IU/L (ref 39–117)
ALT: 10 IU/L (ref 0–32)
AST: 16 IU/L (ref 0–40)
Albumin: 4.2 g/dL (ref 3.5–5.5)
BILIRUBIN TOTAL: 0.7 mg/dL (ref 0.0–1.2)
BUN/Creatinine Ratio: 13 (ref 9–23)
BUN: 10 mg/dL (ref 6–20)
CALCIUM: 9.4 mg/dL (ref 8.7–10.2)
CHLORIDE: 103 mmol/L (ref 96–106)
CO2: 22 mmol/L (ref 20–29)
Creatinine, Ser: 0.8 mg/dL (ref 0.57–1.00)
GFR calc Af Amer: 113 mL/min/{1.73_m2} (ref >59)
GFR calc non Af Amer: 98 mL/min/{1.73_m2} (ref >59)
GLOBULIN, TOTAL: 2.8 g/dL (ref 1.5–4.5)
Glucose: 80 mg/dL (ref 65–99)
POTASSIUM: 4.2 mmol/L (ref 3.5–5.2)
SODIUM: 139 mmol/L (ref 134–144)
Total Protein: 7 g/dL (ref 6.0–8.5)

## 2019-01-10 LAB — HEMOGLOBIN A1C
ESTIMATED AVERAGE GLUCOSE: 103 mg/dL
Hgb A1c MFr Bld: 5.2 % (ref 4.8–5.6)

## 2019-01-10 LAB — INSULIN, RANDOM: INSULIN: 3.2 u[IU]/mL (ref 2.6–24.9)

## 2019-01-10 LAB — FOLATE: FOLATE: 13 ng/mL (ref >3.0)

## 2019-01-10 LAB — VITAMIN B12: Vitamin B-12: 616 pg/mL (ref 232–1245)

## 2019-01-10 LAB — VITAMIN D 25 HYDROXY (VIT D DEFICIENCY, FRACTURES): Vit D, 25-Hydroxy: 7 ng/mL — ABNORMAL LOW (ref 30.0–100.0)

## 2019-01-10 LAB — TSH: TSH: 0.64 u[IU]/mL (ref 0.450–4.500)

## 2019-01-10 LAB — T3: T3, Total: 108 ng/dL (ref 71–180)

## 2019-01-10 NOTE — Assessment & Plan Note (Signed)
Decrease zoloft to 25mg  at bedtime for 3 days, then stop, start lexapro 10mg  at bedtime Bridge with xanax 0.5mg  BID prn Will review the labs from the weight loss physician Recheck her in 4-6 weeks I think getting her mood and healthy eating on track will improve many of her symptoms including libido

## 2019-01-10 NOTE — Progress Notes (Signed)
Office: [phone]  /  Fax: [phone]   Dear Dr. Cherly Hensen,   Thank you for referring Claire Weber to our clinic. The following note includes my evaluation and treatment recommendations.  HPI:   Chief Complaint: OBESITY    Claire Weber has been referred by Claire Better, MD for consultation regarding her obesity and obesity related comorbidities.    Claire Weber (MR# [id number]) is a 32 y.o. female who presents on 01/09/2019 for obesity evaluation and treatment. Current BMI is Body mass index is 41.63 kg/m.Marland Kitchen Claire Weber has been struggling with her weight for many years and has been unsuccessful in either losing weight, maintaining weight loss, or reaching her healthy weight goal.     Claire Weber attended our information session and states she is currently in the action stage of change and ready to dedicate time achieving and maintaining a healthier weight. Claire Weber is interested in becoming our patient and working on intensive lifestyle modifications including (but not limited to) diet, exercise and weight loss.    Claire Weber states her family eats meals together she thinks her family will eat healthier with  her she struggles with family and or coworkers weight loss sabotage her desired weight loss is 70 lbs she started gaining weight in her late 20's her heaviest weight ever was 245 lbs she has significant food cravings issues  she snacks frequently in the evenings she skips meals frequently she is frequently drinking liquids with calories she frequently makes poor food choices she has problems with excessive hunger  she frequently eats larger portions than normal  she struggles with emotional eating    Fatigue Claire Weber feels her energy is lower than it should be. This has worsened with weight gain and has not worsened recently. Claire Weber admits to daytime somnolence and  admits to waking up still tired. Patient is at risk for obstructive sleep apnea. Patent has a history of symptoms of daytime fatigue.  Patient generally gets 6 hours of sleep per night, and states they generally have generally restful sleep. Snoring is not present. Apneic episodes are not present. Epworth Sleepiness Score is 7.  Dyspnea on exertion Claire Weber notes increasing shortness of breath with exercising and seems to be worsening over time with weight gain. She notes getting out of breath sooner with activity than she used to. This has not gotten worse recently. EKG-Normal sinus rhythm, poor R wave progression, nonspecific, T wave abnormality. Claire Weber denies orthopnea.  Abnormal TSH Claire Weber has abnormal TSH, previously low TSH in the past 2 years. She is not on levothyroxine. She denies hot or cold intolerance or palpitations, but does admit to ongoing fatigue.  Vitamin D Deficiency Claire Weber has a diagnosis of vitamin D deficiency. She is not currently taking Vit D supplementation. She denies nausea, vomiting or muscle weakness.  At risk for osteopenia and osteoporosis Claire Weber is at higher risk of osteopenia and osteoporosis due to vitamin D deficiency.   Depression with emotional eating behaviors Claire Weber notes increase in eating and cravings with stress. Claire Weber struggles with emotional eating and using food for comfort to the extent that it is negatively impacting her health. She often snacks when she is not hungry. Claire Weber sometimes feels she is out of control and then feels guilty that she made poor food choices. She has been working on behavior modification techniques to help reduce her emotional eating and has been somewhat successful. She shows no sign of suicidal or homicidal ideations.  Depression screen Claire Weber 2/9 01/09/2019 01/09/2019 10/26/2016 02/16/2016 08/28/2015  Decreased Interest 3 3 2 2 3   Down, Depressed, Hopeless 3 3 - 2 3  PHQ - 2 Score 6 6 2 4 6   Altered sleeping 2 1 0 0 0  Tired, decreased energy 3 3 3 2 3   Change in appetite 2 2 2 1 2   Feeling bad or failure about yourself  1 3 2 1 2   Trouble concentrating 2 1 2 1 2   Moving  slowly or fidgety/restless - 1 2 0 0  Suicidal thoughts 0 0 0 0 0  PHQ-9 Score 16 17 13 9 15   Difficult doing work/chores Very difficult Very difficult - Not difficult at all -    Depression Screen Claire Weber Food and Mood (modified PHQ-9) score was  Depression screen PHQ 2/9 01/09/2019  Decreased Interest 3  Down, Depressed, Hopeless 3  PHQ - 2 Score 6  Altered sleeping 2  Tired, decreased energy 3  Change in appetite 2  Feeling bad or failure about yourself  1  Trouble concentrating 2  Moving slowly or fidgety/restless -  Suicidal thoughts 0  PHQ-9 Score 16  Difficult doing work/chores Very difficult    ASSESSMENT AND PLAN:  Other fatigue - Plan: EKG 12-Lead, CBC with Differential/Platelet, Comprehensive metabolic panel, Hemoglobin A1c, Insulin, random, Folate, Lipid panel, Vitamin B12  Shortness of breath on exertion  Abnormal TSH - Plan: T3, T4, free, TSH  Vitamin D deficiency - Plan: VITAMIN D 25 Hydroxy (Vit-D Deficiency, Fractures)  Other depression - with emotional eating  At risk for osteoporosis  Screening for depression  Class 3 severe obesity with serious comorbidity and body mass index (BMI) of 40.0 to 44.9 in adult, unspecified obesity type (HCC)  PLAN:  Fatigue Claire Weber was informed that her fatigue may be related to obesity, depression or many other causes. Labs will be ordered, and in the meanwhile Claire Weber has agreed to work on diet, exercise and weight loss to help with fatigue. Proper sleep hygiene was discussed including the need for 7-8 hours of quality sleep each night. A sleep study was not ordered based on symptoms and Epworth score.  Dyspnea on exertion Claire Weber shortness of breath appears to be obesity related and exercise induced. She has agreed to work on weight loss and gradually increase exercise to treat her exercise induced shortness of breath. If Claire Weber follows our instructions and loses weight without improvement of her shortness of breath, we will  plan to refer to pulmonology. We will monitor this condition regularly. Claire Weber agrees to this plan.  Abnormal TSH Claire Weber was informed of the importance of good thyroid control to help with weight loss efforts. She was also informed that supertheraputic thyroid levels are dangerous and will not improve weight loss results. We will check thyroid panel today. Claire Weber agrees to follow up with our clinic in 2 weeks.  Vitamin D Deficiency Claire Weber was informed that low vitamin D levels contributes to fatigue and are associated with obesity, breast, and colon cancer. She will follow up for routine testing of vitamin D, at least 2-3 times per year. She was informed of the risk of over-replacement of vitamin D and agrees to not increase her dose unless she discusses this with Claire Weber first. We will check Vit D level today. Claire Weber agrees to follow up with our clinic in 2 weeks.  At risk for osteopenia and osteoporosis Claire Weber was given extended (15 minutes) osteoporosis prevention counseling today. Claire Weber is at risk for osteopenia and osteoporsis due to her vitamin D deficiency. She was  encouraged to take her vitamin D and follow her higher calcium diet and increase strengthening exercise to help strengthen her bones and decrease her risk of osteopenia and osteoporosis.  Depression with Emotional Eating Behaviors We discussed behavior modification techniques today to help Claire Weber deal with her emotional eating and depression. We will refer to Dr. Dewaine Conger, our bariatric psychologist for evaluation. Jahniyah agrees to follow up with our clinic in 2 weeks.  Depression Screen Claire Weber had a strongly positive depression screening. Depression is commonly associated with obesity and often results in emotional eating behaviors. We will monitor this closely and work on CBT to help improve the non-hunger eating patterns. Referral to Psychology may be required if no improvement is seen as she continues in our clinic.  Obesity Claire Weber is currently in the  action stage of change and her goal is to continue with weight loss efforts. I recommend Claire Weber begin the structured treatment plan as follows:  She has agreed to follow the Category 2 plan Claire Weber has been instructed to eventually work up to a goal of 150 minutes of combined cardio and strengthening exercise per week for weight loss and overall health benefits. We discussed the following Behavioral Modification Strategies today: increasing lean protein intake, increasing vegetables, work on meal planning and easy cooking plans, and planning for success   She was informed of the importance of frequent follow up visits to maximize her success with intensive lifestyle modifications for her multiple health conditions. She was informed we would discuss her lab results at her next visit unless there is a critical issue that needs to be addressed sooner. Claire Weber agreed to keep her next visit at the agreed upon time to discuss these results.  ALLERGIES: No Known Allergies  MEDICATIONS: Current Outpatient Medications on File Prior to Visit  Medication Sig Dispense Refill  . folic acid (FOLVITE) 1 MG tablet Take 1 mg by mouth daily.    Marland Kitchen levonorgestrel (MIRENA) 20 MCG/24HR IUD 1 each by Intrauterine route once.    . sertraline (ZOLOFT) 50 MG tablet Take 50 mg by mouth daily.     No current facility-administered medications on file prior to visit.     PAST MEDICAL HISTORY: Past Medical History:  Diagnosis Date  . Anxiety   . Depression   . Medical history non-contributory     PAST SURGICAL HISTORY: Past Surgical History:  Procedure Laterality Date  . ABDOMINAL HERNIA REPAIR     at age 54    SOCIAL HISTORY: Social History   Tobacco Use  . Smoking status: Never Smoker  . Smokeless tobacco: Never Used  Substance Use Topics  . Alcohol use: Yes    Comment: wine on occ  . Drug use: No    FAMILY HISTORY: Family History  Problem Relation Age of Onset  . Hypertension Mother   .  Hyperlipidemia Mother   . Obesity Mother   . Hypertension Father   . Diabetes Father     ROS: Review of Systems  Constitutional: Positive for malaise/fatigue. Negative for weight loss.       + Trouble sleeping  Respiratory: Positive for shortness of breath.   Cardiovascular: Negative for palpitations and orthopnea.  Gastrointestinal: Negative for nausea and vomiting.  Musculoskeletal:       Negative muscle weakness  Skin:       + Dryness + Hair or nail changes  Neurological: Positive for headaches.  Endo/Heme/Allergies:       Negative hot/cold intolerance Positive polyphagia  Psychiatric/Behavioral: Positive for  depression. Negative for suicidal ideas. The patient is nervous/anxious.        + Stress    PHYSICAL EXAM: Blood pressure 122/79, pulse 62, temperature 97.8 F (36.6 C), temperature source Oral, height 5\' 3"  (1.6 m), weight 235 lb (106.6 kg), SpO2 98 %. Body mass index is 41.63 kg/m. Physical Exam Vitals signs reviewed.  Constitutional:      Appearance: Normal appearance. She is obese.  HENT:     Head: Normocephalic and atraumatic.     Nose: Nose normal.  Eyes:     General: No scleral icterus.    Extraocular Movements: Extraocular movements intact.  Neck:     Musculoskeletal: Normal range of motion and neck supple.     Comments: No thyromegaly present Cardiovascular:     Rate and Rhythm: Normal rate and regular rhythm.     Pulses: Normal pulses.     Heart sounds: Normal heart sounds.  Pulmonary:     Effort: Pulmonary effort is normal. No respiratory distress.     Breath sounds: Normal breath sounds.  Abdominal:     Palpations: Abdomen is soft.     Tenderness: There is no abdominal tenderness.     Comments: + Obesity  Musculoskeletal: Normal range of motion.     Right lower leg: No edema.     Left lower leg: No edema.  Skin:    General: Skin is warm and dry.  Neurological:     Mental Status: She is alert and oriented to person, place, and time.      Coordination: Coordination normal.  Psychiatric:        Mood and Affect: Mood normal.        Behavior: Behavior normal.     RECENT LABS AND TESTS: BMET    Component Value Date/Time   NA 139 01/09/2019 1256   K 4.2 01/09/2019 1256   CL 103 01/09/2019 1256   CO2 22 01/09/2019 1256   GLUCOSE 80 01/09/2019 1256   GLUCOSE 89 02/27/2016 0912   BUN 10 01/09/2019 1256   CREATININE 0.80 01/09/2019 1256   CREATININE 0.90 02/27/2016 0912   CALCIUM 9.4 01/09/2019 1256   GFRNONAA 98 01/09/2019 1256   GFRAA 113 01/09/2019 1256   Lab Results  Component Value Date   HGBA1C 5.2 01/09/2019   Lab Results  Component Value Date   INSULIN 3.2 01/09/2019   CBC    Component Value Date/Time   WBC 5.5 01/09/2019 1256   WBC 4.1 02/27/2016 0912   RBC 4.23 01/09/2019 1256   RBC 4.46 02/27/2016 0912   HGB 14.1 01/09/2019 1256   HCT 38.8 01/09/2019 1256   PLT 231 01/09/2019 1256   MCV 92 01/09/2019 1256   MCH 33.3 (H) 01/09/2019 1256   MCH 31.6 02/27/2016 0912   MCHC 36.3 (H) 01/09/2019 1256   MCHC 34.6 02/27/2016 0912   RDW 12.1 01/09/2019 1256   LYMPHSABS 2.1 01/09/2019 1256   MONOABS 0.6 02/27/2016 0912   EOSABS 0.1 01/09/2019 1256   BASOSABS 0.0 01/09/2019 1256   Iron/TIBC/Ferritin/ %Sat No results found for: IRON, TIBC, FERRITIN, IRONPCTSAT Lipid Panel     Component Value Date/Time   CHOL 121 01/09/2019 1256   TRIG 41 01/09/2019 1256   HDL 45 01/09/2019 1256   CHOLHDL 2.7 01/09/2019 1256   CHOLHDL 2.9 02/27/2016 0912   VLDL 8 02/27/2016 0912   LDLCALC 68 01/09/2019 1256   Hepatic Function Panel     Component Value Date/Time   PROT 7.0 01/09/2019  1256   ALBUMIN 4.2 01/09/2019 1256   AST 16 01/09/2019 1256   ALT 10 01/09/2019 1256   ALKPHOS 104 01/09/2019 1256   BILITOT 0.7 01/09/2019 1256      Component Value Date/Time   TSH 0.640 01/09/2019 1256   TSH 0.36 (L) 05/20/2016 0850   TSH 0.39 (L) 02/27/2016 0912    ECG  shows NSR with a rate of 64 BPM INDIRECT  CALORIMETER done today shows a VO2 of 224 and a REE of 1561.  Her calculated basal metabolic rate is 6045 thus her basal metabolic rate is worse than expected.       OBESITY BEHAVIORAL INTERVENTION VISIT  Today's visit was # 1   Starting weight: 235 lbs Starting date: 01/09/2019 Today's weight : 235 lbs  Today's date: 01/09/2019 Total lbs lost to date: 0    ASK: We discussed the diagnosis of obesity with Claire Weber today and Claire Weber agreed to give [place] permission to discuss obesity behavioral modification therapy today.  ASSESS: Damon has the diagnosis of obesity and her BMI today is 41.64 Shaden is in the action stage of change   ADVISE: Sylvana was educated on the multiple health risks of obesity as well as the benefit of weight loss to improve her health. She was advised of the need for long term treatment and the importance of lifestyle modifications to improve her current health and to decrease her risk of future health problems.  AGREE: Multiple dietary modification options and treatment options were discussed and  Trecia agreed to follow the recommendations documented in the above note.  ARRANGE: Joliyah was educated on the importance of frequent visits to treat obesity as outlined per CMS and USPSTF guidelines and agreed to schedule her next follow up appointment today.  I, Burt Knack, am acting as transcriptionist for Debbra Riding, MD  I have reviewed the above documentation for accuracy and completeness, and I agree with the above. - Debbra Riding, MD

## 2019-01-23 ENCOUNTER — Encounter (INDEPENDENT_AMBULATORY_CARE_PROVIDER_SITE_OTHER): Payer: Self-pay | Admitting: Bariatrics

## 2019-01-23 ENCOUNTER — Ambulatory Visit (INDEPENDENT_AMBULATORY_CARE_PROVIDER_SITE_OTHER): Payer: Medicaid Other | Admitting: Family Medicine

## 2019-01-23 ENCOUNTER — Encounter (INDEPENDENT_AMBULATORY_CARE_PROVIDER_SITE_OTHER): Payer: Self-pay

## 2019-01-23 ENCOUNTER — Ambulatory Visit (INDEPENDENT_AMBULATORY_CARE_PROVIDER_SITE_OTHER): Payer: BLUE CROSS/BLUE SHIELD | Admitting: Bariatrics

## 2019-01-23 VITALS — BP 113/75 | HR 71 | Temp 97.9°F | Ht 63.0 in | Wt 230.0 lb

## 2019-01-23 DIAGNOSIS — E559 Vitamin D deficiency, unspecified: Secondary | ICD-10-CM | POA: Diagnosis not present

## 2019-01-23 DIAGNOSIS — F5104 Psychophysiologic insomnia: Secondary | ICD-10-CM

## 2019-01-23 DIAGNOSIS — F3289 Other specified depressive episodes: Secondary | ICD-10-CM

## 2019-01-23 DIAGNOSIS — Z6841 Body Mass Index (BMI) 40.0 and over, adult: Secondary | ICD-10-CM

## 2019-01-23 DIAGNOSIS — Z9189 Other specified personal risk factors, not elsewhere classified: Secondary | ICD-10-CM | POA: Diagnosis not present

## 2019-01-23 MED ORDER — VITAMIN D (ERGOCALCIFEROL) 1.25 MG (50000 UNIT) PO CAPS: 50000.0000 [IU] | ORAL_CAPSULE | ORAL | 0 refills | Status: DC

## 2019-01-24 NOTE — Progress Notes (Signed)
Office: [phone]  /  Fax: [phone]   HPI:   Chief Complaint: OBESITY Claire Weber is here to discuss her progress with her obesity treatment plan. She is on the Category 2 plan and is following her eating plan approximately 75 % of the time. She states she is exercising 0 minutes 0 times per week. Claire Weber is doing well. She was able to stop sweets over the last 2 weeks. She gets slightly hungry between breakfast and lunch.  Her weight is 230 lb (104.3 kg) today and has had a weight loss of 5 pounds over a period of 2 weeks since her last visit. She has lost 5 lbs since starting treatment with [place].  Vitamin D Deficiency Claire Weber has a diagnosis of vitamin D deficiency. She is not currently taking Vit D. Last vit D level was 7.0. She denies nausea, vomiting or muscle weakness. IUD in plan.  At risk for osteopenia and osteoporosis Claire Weber is at higher risk of osteopenia and osteoporosis due to vitamin D deficiency.   Insomnia Claire Weber complains of insomnia. She is having problems with going to sleep and staying asleep.    Depression with emotional eating behaviors Claire Weber is taking Lexapro and is struggling with emotional eating and using food for comfort to the extent that it is negatively impacting her health. She often snacks when she is not hungry. Claire Weber sometimes feels she is out of control and then feels guilty that she made poor food choices. She has been working on behavior modification techniques to help reduce her emotional eating and has been somewhat successful. She shows no sign of suicidal or homicidal ideations.  Depression screen Claire Weber 2/9 01/09/2019 01/09/2019 10/26/2016 02/16/2016 08/28/2015  Decreased Interest 3 3 2 2 3   Down, Depressed, Hopeless 3 3 - 2 3  PHQ - 2 Score 6 6 2 4 6   Altered sleeping 2 1 0 0 0  Tired, decreased energy 3 3 3 2 3   Change in appetite 2 2 2 1 2   Feeling bad or failure about yourself  1 3 2 1 2   Trouble concentrating 2 1 2 1 2   Moving slowly or fidgety/restless - 1  2 0 0  Suicidal thoughts 0 0 0 0 0  PHQ-9 Score 16 17 13 9 15   Difficult doing work/chores Very difficult Very difficult - Not difficult at all -    ASSESSMENT AND PLAN:  Vitamin D deficiency - Plan: Vitamin D, Ergocalciferol, (DRISDOL) 1.25 MG (50000 UT) CAPS capsule  Chronic insomnia  Other depression - with emotional eating  At risk for osteoporosis  Class 3 severe obesity with serious comorbidity and body mass index (BMI) of 40.0 to 44.9 in adult, unspecified obesity type (HCC)  PLAN:  Vitamin D Deficiency Claire Weber was informed that low vitamin D levels contributes to fatigue and are associated with obesity, breast, and colon cancer. Claire Weber agrees to start prescription Vit D @50 ,000 IU every week #4 with no refills. She will follow up for routine testing of vitamin D, at least 2-3 times per year. She was informed of the risk of over-replacement of vitamin D and agrees to not increase her dose unless she discusses this with [place] first. Claire Weber agrees to follow up with our clinic in 2 weeks.  At risk for osteopenia and osteoporosis Claire Weber was given extended (15 minutes) osteoporosis prevention counseling today. Claire Weber is at risk for osteopenia and osteoporsis due to her vitamin D deficiency. She was encouraged to take her vitamin D and follow her  higher calcium diet and increase strengthening exercise to help strengthen her bones and decrease her risk of osteopenia and osteoporosis.  Insomnia The problem of recurrent insomnia was discussed. Avoidance of caffeine sources was strongly encouraged and sleep hygiene issues were reviewed. Claire Weber will begin melatonin 50 mg OTC, and she agrees to follow up with our clinic in 2 weeks.  Depression with Emotional Eating Behaviors We discussed behavior modification techniques today to help Claire Weber deal with her emotional eating and depression. Claire Weber agrees to continue her medications and she agrees to follow up with our clinic in 2 weeks.  Obesity Claire Weber is  currently in the action stage of change. As such, her goal is to continue with weight loss efforts She has agreed to follow the Category 2 plan Claire Weber has been instructed to work up to a goal of 150 minutes of combined cardio and strengthening exercise per week for weight loss and overall health benefits. We discussed the following Behavioral Modification Strategies today: increasing lean protein intake, decreasing simple carbohydrates, increasing vegetables, work on meal planning and easy cooking plans, increase H20 intake, and keeping healthy foods in the home Claire Weber is to snack between breakfast and lunch, and increased her protein with more high protein food.  Claire Weber has agreed to follow up with our clinic in 2 weeks. She was informed of the importance of frequent follow up visits to maximize her success with intensive lifestyle modifications for her multiple health conditions.  ALLERGIES: No Known Allergies  MEDICATIONS: Current Outpatient Medications on File Prior to Visit  Medication Sig Dispense Refill  . ALPRAZolam (XANAX) 0.5 MG tablet Take 1 tablet (0.5 mg total) by mouth 2 (two) times daily as needed for anxiety. 30 tablet 1  . escitalopram (LEXAPRO) 10 MG tablet Take 1 tablet (10 mg total) by mouth at bedtime. 30 tablet 3  . folic acid (FOLVITE) 1 MG tablet Take 1 mg by mouth daily.    Marland Kitchen levonorgestrel (MIRENA) 20 MCG/24HR IUD 1 each by Intrauterine route once.    . Melatonin 5 MG TABS Take 1 tablet by mouth at bedtime.     No current facility-administered medications on file prior to visit.     PAST MEDICAL HISTORY: Past Medical History:  Diagnosis Date  . Anxiety   . Depression   . Medical history non-contributory     PAST SURGICAL HISTORY: Past Surgical History:  Procedure Laterality Date  . ABDOMINAL HERNIA REPAIR     at age 32    SOCIAL HISTORY: Social History   Tobacco Use  . Smoking status: Never Smoker  . Smokeless tobacco: Never Used  Substance Use Topics    . Alcohol use: Yes    Comment: wine on occ  . Drug use: No    FAMILY HISTORY: Family History  Problem Relation Age of Onset  . Hypertension Mother   . Hyperlipidemia Mother   . Obesity Mother   . Hypertension Father   . Diabetes Father     ROS: Review of Systems  Constitutional: Positive for weight loss.  Gastrointestinal: Negative for nausea and vomiting.  Musculoskeletal:       Negative muscle weakness  Psychiatric/Behavioral: Positive for depression. Negative for suicidal ideas. The patient has insomnia.     PHYSICAL EXAM: Blood pressure 113/75, pulse 71, temperature 97.9 F (36.6 C), temperature source Oral, height 5\' 3"  (1.6 m), weight 230 lb (104.3 kg), SpO2 99 %. Body mass index is 40.74 kg/m. Physical Exam Vitals signs reviewed.  Constitutional:  Appearance: Normal appearance. She is obese.  Cardiovascular:     Rate and Rhythm: Normal rate.     Pulses: Normal pulses.  Pulmonary:     Effort: Pulmonary effort is normal.     Breath sounds: Normal breath sounds.  Musculoskeletal: Normal range of motion.  Skin:    General: Skin is warm and dry.  Neurological:     Mental Status: She is alert and oriented to person, place, and time.  Psychiatric:        Mood and Affect: Mood normal.        Behavior: Behavior normal.     RECENT LABS AND TESTS: BMET    Component Value Date/Time   NA 139 01/09/2019 1256   K 4.2 01/09/2019 1256   CL 103 01/09/2019 1256   CO2 22 01/09/2019 1256   GLUCOSE 80 01/09/2019 1256   GLUCOSE 89 02/27/2016 0912   BUN 10 01/09/2019 1256   CREATININE 0.80 01/09/2019 1256   CREATININE 0.90 02/27/2016 0912   CALCIUM 9.4 01/09/2019 1256   GFRNONAA 98 01/09/2019 1256   GFRAA 113 01/09/2019 1256   Lab Results  Component Value Date   HGBA1C 5.2 01/09/2019   Lab Results  Component Value Date   INSULIN 3.2 01/09/2019   CBC    Component Value Date/Time   WBC 5.5 01/09/2019 1256   WBC 4.1 02/27/2016 0912   RBC 4.23  01/09/2019 1256   RBC 4.46 02/27/2016 0912   HGB 14.1 01/09/2019 1256   HCT 38.8 01/09/2019 1256   PLT 231 01/09/2019 1256   MCV 92 01/09/2019 1256   MCH 33.3 (H) 01/09/2019 1256   MCH 31.6 02/27/2016 0912   MCHC 36.3 (H) 01/09/2019 1256   MCHC 34.6 02/27/2016 0912   RDW 12.1 01/09/2019 1256   LYMPHSABS 2.1 01/09/2019 1256   MONOABS 0.6 02/27/2016 0912   EOSABS 0.1 01/09/2019 1256   BASOSABS 0.0 01/09/2019 1256   Iron/TIBC/Ferritin/ %Sat No results found for: IRON, TIBC, FERRITIN, IRONPCTSAT Lipid Panel     Component Value Date/Time   CHOL 121 01/09/2019 1256   TRIG 41 01/09/2019 1256   HDL 45 01/09/2019 1256   CHOLHDL 2.7 01/09/2019 1256   CHOLHDL 2.9 02/27/2016 0912   VLDL 8 02/27/2016 0912   LDLCALC 68 01/09/2019 1256   Hepatic Function Panel     Component Value Date/Time   PROT 7.0 01/09/2019 1256   ALBUMIN 4.2 01/09/2019 1256   AST 16 01/09/2019 1256   ALT 10 01/09/2019 1256   ALKPHOS 104 01/09/2019 1256   BILITOT 0.7 01/09/2019 1256      Component Value Date/Time   TSH 0.640 01/09/2019 1256   TSH 0.36 (L) 05/20/2016 0850   TSH 0.39 (L) 02/27/2016 0912      OBESITY BEHAVIORAL INTERVENTION VISIT  Today's visit was # 2   Starting weight: 235 lbs Starting date: 01/09/2019 Today's weight : 230 lbs Today's date: 01/23/2019 Total lbs lost to date: 5    ASK: We discussed the diagnosis of obesity with Tana Conch today and Jamar agreed to give [place] permission to discuss obesity behavioral modification therapy today.  ASSESS: Malie has the diagnosis of obesity and her BMI today is 40.75 Kiearra is in the action stage of change   ADVISE: Emaan was educated on the multiple health risks of obesity as well as the benefit of weight loss to improve her health. She was advised of the need for long term treatment and the importance of lifestyle modifications to  improve her current health and to decrease her risk of future health problems.  AGREE: Multiple dietary  modification options and treatment options were discussed and  Claire StanleyLisa agreed to follow the recommendations documented in the above note.  ARRANGE: Claire StanleyLisa was educated on the importance of frequent visits to treat obesity as outlined per CMS and USPSTF guidelines and agreed to schedule her next follow up appointment today.  Trude McburneyI, Sharon Martin, am acting as transcriptionist for [employer] Hampton Cost, DO  I have reviewed the above documentation for accuracy and completeness, and I agree with the above. -Corinna CapraAngel Audryna Wendt, DO

## 2019-02-04 ENCOUNTER — Ambulatory Visit (INDEPENDENT_AMBULATORY_CARE_PROVIDER_SITE_OTHER): Payer: BLUE CROSS/BLUE SHIELD | Admitting: Bariatrics

## 2019-02-12 ENCOUNTER — Encounter (INDEPENDENT_AMBULATORY_CARE_PROVIDER_SITE_OTHER): Payer: Self-pay | Admitting: Family Medicine

## 2019-02-12 ENCOUNTER — Ambulatory Visit (INDEPENDENT_AMBULATORY_CARE_PROVIDER_SITE_OTHER): Payer: BLUE CROSS/BLUE SHIELD | Admitting: Family Medicine

## 2019-02-12 VITALS — BP 107/76 | HR 69 | Temp 97.9°F | Ht 63.0 in | Wt 231.0 lb

## 2019-02-12 DIAGNOSIS — F3289 Other specified depressive episodes: Secondary | ICD-10-CM

## 2019-02-12 DIAGNOSIS — Z9189 Other specified personal risk factors, not elsewhere classified: Secondary | ICD-10-CM | POA: Diagnosis not present

## 2019-02-12 DIAGNOSIS — E559 Vitamin D deficiency, unspecified: Secondary | ICD-10-CM

## 2019-02-12 DIAGNOSIS — Z6841 Body Mass Index (BMI) 40.0 and over, adult: Secondary | ICD-10-CM | POA: Diagnosis not present

## 2019-02-12 DIAGNOSIS — E66813 Obesity, class 3: Secondary | ICD-10-CM

## 2019-02-12 MED ORDER — VITAMIN D (ERGOCALCIFEROL) 1.25 MG (50000 UNIT) PO CAPS: 50000.0000 [IU] | ORAL_CAPSULE | ORAL | 0 refills | Status: DC

## 2019-02-13 NOTE — Progress Notes (Signed)
Office: [phone]  /  Fax: [phone]   HPI:   Chief Complaint: OBESITY Claire Weber is here to discuss her progress with her obesity treatment plan. She is on the Category 2 plan and is following her eating plan approximately 30 % of the time. She states she is exercising 0 minutes 0 times per week. Claire Weber has increase in stress for the past few weeks secondary to her daughter being sick and had to go to the emergency room. She also had to take care of her mother who hurt her arm. She fell back into old habits of eating fast food.  Her weight is 231 lb (104.8 kg) today and has gained 1 pound since her last visit. She has lost 4 lbs since starting treatment with [place].  Vitamin D Deficiency Claire Weber has a diagnosis of vitamin D deficiency. She is currently taking prescription Vit D. She notes fatigue and denies nausea, vomiting or muscle weakness.  At risk for osteopenia and osteoporosis Claire Weber is at higher risk of osteopenia and osteoporosis due to vitamin D deficiency.   Depression with emotional eating behaviors Claire Weber is on Lexapro and symptoms are controlled. Claire Weber struggles with emotional eating and using food for comfort to the extent that it is negatively impacting her health. She often snacks when she is not hungry. Claire Weber sometimes feels she is out of control and then feels guilty that she made poor food choices. She has been working on behavior modification techniques to help reduce her emotional eating and has been somewhat successful. She shows no sign of suicidal or homicidal ideations.  Depression screen Claire Weber 2/9 01/09/2019 01/09/2019 10/26/2016 02/16/2016 08/28/2015  Decreased Interest 3 3 2 2 3   Down, Depressed, Hopeless 3 3 - 2 3  PHQ - 2 Score 6 6 2 4 6   Altered sleeping 2 1 0 0 0  Tired, decreased energy 3 3 3 2 3   Change in appetite 2 2 2 1 2   Feeling bad or failure about yourself  1 3 2 1 2   Trouble concentrating 2 1 2 1 2   Moving slowly or fidgety/restless - 1 2 0 0  Suicidal thoughts 0  0 0 0 0  PHQ-9 Score 16 17 13 9 15   Difficult doing work/chores Very difficult Very difficult - Not difficult at all -    ASSESSMENT AND PLAN:  Vitamin D deficiency - Plan: Vitamin D, Ergocalciferol, (DRISDOL) 1.25 MG (50000 UT) CAPS capsule  Other depression - with emotional eating   At risk for osteoporosis  Class 3 severe obesity with serious comorbidity and body mass index (BMI) of 45.0 to 49.9 in adult, unspecified obesity type (HCC)  PLAN:  Vitamin D Deficiency Claire Weber was informed that low vitamin D levels contributes to fatigue and are associated with obesity, breast, and colon cancer. Claire Weber agrees to continue taking prescription Vit D @50 ,000 IU every week #4 and we will refill for 1 month. She will follow up for routine testing of vitamin D, at least 2-3 times per year. She was informed of the risk of over-replacement of vitamin D and agrees to not increase her dose unless she discusses this with [place] first. Claire Weber agrees to follow up with our clinic in 2 weeks.  At risk for osteopenia and osteoporosis Claire Weber was given extended (15 minutes) osteoporosis prevention counseling today. Claire Weber is at risk for osteopenia and osteoporsis due to her vitamin D deficiency. She was encouraged to take her vitamin D and follow her higher calcium diet and increase  strengthening exercise to help strengthen her bones and decrease her risk of osteopenia and osteoporosis.  Depression with Emotional Eating Behaviors We discussed behavior modification techniques today to help Claire Weber deal with her emotional eating and depression. Claire Weber agrees to continue taking Lexapro and she agrees to follow up with our clinic in 2 weeks.  Obesity Claire Weber is currently in the action stage of change. As such, her goal is to continue with weight loss efforts She has agreed to keep a food journal with 400-500 calories and 35+ grams of protein at supper daily and follow the Category 2 plan Claire Weber has been instructed to work up to a goal  of 150 minutes of combined cardio and strengthening exercise per week for weight loss and overall health benefits. We discussed the following Behavioral Modification Strategies today: increasing lean protein intake, increasing vegetables, work on meal planning and easy cooking plans, better snacking choices, and planning for success   Claire Weber has agreed to follow up with our clinic in 2 weeks. She was informed of the importance of frequent follow up visits to maximize her success with intensive lifestyle modifications for her multiple health conditions.  ALLERGIES: No Known Allergies  MEDICATIONS: Current Outpatient Medications on File Prior to Visit  Medication Sig Dispense Refill  . ALPRAZolam (XANAX) 0.5 MG tablet Take 1 tablet (0.5 mg total) by mouth 2 (two) times daily as needed for anxiety. 30 tablet 1  . escitalopram (LEXAPRO) 10 MG tablet Take 1 tablet (10 mg total) by mouth at bedtime. 30 tablet 3  . folic acid (FOLVITE) 1 MG tablet Take 1 mg by mouth daily.    Marland Kitchen. levonorgestrel (MIRENA) 20 MCG/24HR IUD 1 each by Intrauterine route once.    . Melatonin 5 MG TABS Take 1 tablet by mouth at bedtime.     No current facility-administered medications on file prior to visit.     PAST MEDICAL HISTORY: Past Medical History:  Diagnosis Date  . Anxiety   . Depression   . Medical history non-contributory     PAST SURGICAL HISTORY: Past Surgical History:  Procedure Laterality Date  . ABDOMINAL HERNIA REPAIR     at age 89    SOCIAL HISTORY: Social History   Tobacco Use  . Smoking status: Never Smoker  . Smokeless tobacco: Never Used  Substance Use Topics  . Alcohol use: Yes    Comment: wine on occ  . Drug use: No    FAMILY HISTORY: Family History  Problem Relation Age of Onset  . Hypertension Mother   . Hyperlipidemia Mother   . Obesity Mother   . Hypertension Father   . Diabetes Father     ROS: Review of Systems  Constitutional: Positive for malaise/fatigue.  Negative for weight loss.  Gastrointestinal: Negative for nausea and vomiting.  Musculoskeletal:       Negative muscle weakness  Psychiatric/Behavioral: Positive for depression. Negative for suicidal ideas.    PHYSICAL EXAM: Blood pressure 107/76, pulse 69, temperature 97.9 F (36.6 C), temperature source Oral, height 5\' 3"  (1.6 m), weight 231 lb (104.8 kg), SpO2 99 %. Body mass index is 40.92 kg/m. Physical Exam Vitals signs reviewed.  Constitutional:      Appearance: Normal appearance. She is obese.  Cardiovascular:     Rate and Rhythm: Normal rate.     Pulses: Normal pulses.  Pulmonary:     Effort: Pulmonary effort is normal.     Breath sounds: Normal breath sounds.  Musculoskeletal: Normal range of motion.  Skin:  General: Skin is warm and dry.  Neurological:     Mental Status: She is alert and oriented to person, place, and time.  Psychiatric:        Mood and Affect: Mood normal.        Behavior: Behavior normal.     RECENT LABS AND TESTS: BMET    Component Value Date/Time   NA 139 01/09/2019 1256   K 4.2 01/09/2019 1256   CL 103 01/09/2019 1256   CO2 22 01/09/2019 1256   GLUCOSE 80 01/09/2019 1256   GLUCOSE 89 02/27/2016 0912   BUN 10 01/09/2019 1256   CREATININE 0.80 01/09/2019 1256   CREATININE 0.90 02/27/2016 0912   CALCIUM 9.4 01/09/2019 1256   GFRNONAA 98 01/09/2019 1256   GFRAA 113 01/09/2019 1256   Lab Results  Component Value Date   HGBA1C 5.2 01/09/2019   Lab Results  Component Value Date   INSULIN 3.2 01/09/2019   CBC    Component Value Date/Time   WBC 5.5 01/09/2019 1256   WBC 4.1 02/27/2016 0912   RBC 4.23 01/09/2019 1256   RBC 4.46 02/27/2016 0912   HGB 14.1 01/09/2019 1256   HCT 38.8 01/09/2019 1256   PLT 231 01/09/2019 1256   MCV 92 01/09/2019 1256   MCH 33.3 (H) 01/09/2019 1256   MCH 31.6 02/27/2016 0912   MCHC 36.3 (H) 01/09/2019 1256   MCHC 34.6 02/27/2016 0912   RDW 12.1 01/09/2019 1256   LYMPHSABS 2.1 01/09/2019  1256   MONOABS 0.6 02/27/2016 0912   EOSABS 0.1 01/09/2019 1256   BASOSABS 0.0 01/09/2019 1256   Iron/TIBC/Ferritin/ %Sat No results found for: IRON, TIBC, FERRITIN, IRONPCTSAT Lipid Panel     Component Value Date/Time   CHOL 121 01/09/2019 1256   TRIG 41 01/09/2019 1256   HDL 45 01/09/2019 1256   CHOLHDL 2.7 01/09/2019 1256   CHOLHDL 2.9 02/27/2016 0912   VLDL 8 02/27/2016 0912   LDLCALC 68 01/09/2019 1256   Hepatic Function Panel     Component Value Date/Time   PROT 7.0 01/09/2019 1256   ALBUMIN 4.2 01/09/2019 1256   AST 16 01/09/2019 1256   ALT 10 01/09/2019 1256   ALKPHOS 104 01/09/2019 1256   BILITOT 0.7 01/09/2019 1256      Component Value Date/Time   TSH 0.640 01/09/2019 1256   TSH 0.36 (L) 05/20/2016 0850   TSH 0.39 (L) 02/27/2016 0912      OBESITY BEHAVIORAL INTERVENTION VISIT  Today's visit was # 3   Starting weight: 235 lbs Starting date: 01/09/2019 Today's weight : 231 lbs Today's date: 02/12/2019 Total lbs lost to date: 4    ASK: We discussed the diagnosis of obesity with Tana Conch today and Cici agreed to give [place] permission to discuss obesity behavioral modification therapy today.  ASSESS: Emaly has the diagnosis of obesity and her BMI today is 40.93 Erienne is in the action stage of change   ADVISE: Maelene was educated on the multiple health risks of obesity as well as the benefit of weight loss to improve her health. She was advised of the need for long term treatment and the importance of lifestyle modifications to improve her current health and to decrease her risk of future health problems.  AGREE: Multiple dietary modification options and treatment options were discussed and  Mahlea agreed to follow the recommendations documented in the above note.  ARRANGE: Jaidin was educated on the importance of frequent visits to treat obesity as outlined per CMS  and USPSTF guidelines and agreed to schedule her next follow up appointment today.  I,  Burt KnackSharon Martin, am acting as transcriptionist for Debbra RidingAlexandria Kadolph, MD  I have reviewed the above documentation for accuracy and completeness, and I agree with the above. - Debbra RidingAlexandria Kadolph, MD

## 2019-02-28 ENCOUNTER — Ambulatory Visit (INDEPENDENT_AMBULATORY_CARE_PROVIDER_SITE_OTHER): Payer: Medicaid Other | Admitting: Bariatrics

## 2019-03-24 ENCOUNTER — Other Ambulatory Visit (INDEPENDENT_AMBULATORY_CARE_PROVIDER_SITE_OTHER): Payer: Self-pay | Admitting: Family Medicine

## 2019-03-24 ENCOUNTER — Encounter (INDEPENDENT_AMBULATORY_CARE_PROVIDER_SITE_OTHER): Payer: Self-pay | Admitting: Family Medicine

## 2019-03-24 DIAGNOSIS — E559 Vitamin D deficiency, unspecified: Secondary | ICD-10-CM

## 2019-03-25 ENCOUNTER — Encounter (INDEPENDENT_AMBULATORY_CARE_PROVIDER_SITE_OTHER): Payer: Self-pay

## 2019-03-26 ENCOUNTER — Ambulatory Visit (INDEPENDENT_AMBULATORY_CARE_PROVIDER_SITE_OTHER): Payer: BLUE CROSS/BLUE SHIELD | Admitting: Family Medicine

## 2019-03-26 ENCOUNTER — Other Ambulatory Visit: Payer: Self-pay

## 2019-03-26 ENCOUNTER — Encounter: Payer: Self-pay | Admitting: Family Medicine

## 2019-03-26 DIAGNOSIS — F5104 Psychophysiologic insomnia: Secondary | ICD-10-CM

## 2019-03-26 DIAGNOSIS — F331 Major depressive disorder, recurrent, moderate: Secondary | ICD-10-CM | POA: Diagnosis not present

## 2019-03-26 DIAGNOSIS — L659 Nonscarring hair loss, unspecified: Secondary | ICD-10-CM | POA: Diagnosis not present

## 2019-03-26 DIAGNOSIS — Z6841 Body Mass Index (BMI) 40.0 and over, adult: Secondary | ICD-10-CM

## 2019-03-26 MED ORDER — ESCITALOPRAM OXALATE 20 MG PO TABS: 20.0000 mg | ORAL_TABLET | Freq: Every day | ORAL | 3 refills | Status: DC

## 2019-03-26 NOTE — Progress Notes (Signed)
Virtual Visit via Telephone Note  I connected with Claire Weber on 03/26/19 at 10:41 by telephone and verified that I am speaking with the correct person using two identifiers.  Pt location: At home [facility] location:at home, Milinda Antis MD   I discussed the limitations, risks, security and privacy concerns of performing an evaluation and management service by telephone and the availability of in person appointments. I also discussed with the patient that there may be a patient responsible charge related to this service. The patient expressed understanding and agreed to proceed.   History of Present Illness:    MDD- reviewed visit from 1/15, she was tapered off zoloft, and started on lexapro 10mg  with xanax as a bridge, she used xanax almost every night but was concerned for getting addicted so she stopped using as often. Still feels her mind racing at night but feels better in general on the lexapro without any side effects. She still has not energy and low libido She has gained all the weight back she previously lost, admits she started stress eating and now working from home with her daughter in the house and she finds her self snacking and not eating healthy foods. 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, started on this in November     Observations/Objective: Unable, but appropriate affect and response to questioning,   Assessment and Plan:   MDD/GAD- increase lexapro to 20mg , use xanax sparingly as we adjust the lexapro  exercise  and diet, discussed how this plays into the fatigue, poor sleep, libido  Obesity- discussed possible use of phentermine or saxenda, she wants to hold off at this time, discussed meal prep, to keep her from snacking on unhealthy foods   Hair thinning mostlky due to stress, labs normal except vitamin D, anti-depressants in small subset can cause thisnas well, can try hair vitamin  Vitamin D def recommend she take weekly for at least 3 months    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 The patient was provided an opportunity to ask questions and all were answered. The patient agreed with the plan and demonstrated an understanding of the instructions.   The patient was advised to call back or seek an in-person evaluation if the symptoms worsen or if the condition fails to improve as anticipated.  I provided 28  minutes of non-face-to-face time during this encounter. End time 11:09  Milinda Antis, MD

## 2019-04-16 ENCOUNTER — Other Ambulatory Visit: Payer: Self-pay

## 2019-04-16 ENCOUNTER — Ambulatory Visit (INDEPENDENT_AMBULATORY_CARE_PROVIDER_SITE_OTHER): Payer: BLUE CROSS/BLUE SHIELD | Admitting: Family Medicine

## 2019-04-16 ENCOUNTER — Encounter: Payer: Self-pay | Admitting: Family Medicine

## 2019-04-16 DIAGNOSIS — L659 Nonscarring hair loss, unspecified: Secondary | ICD-10-CM | POA: Diagnosis not present

## 2019-04-16 DIAGNOSIS — F5104 Psychophysiologic insomnia: Secondary | ICD-10-CM

## 2019-04-16 DIAGNOSIS — E559 Vitamin D deficiency, unspecified: Secondary | ICD-10-CM

## 2019-04-16 DIAGNOSIS — Z6841 Body Mass Index (BMI) 40.0 and over, adult: Secondary | ICD-10-CM

## 2019-04-16 DIAGNOSIS — F331 Major depressive disorder, recurrent, moderate: Secondary | ICD-10-CM

## 2019-04-16 MED ORDER — ALPRAZOLAM 0.5 MG PO TABS: 0.5000 mg | ORAL_TABLET | Freq: Two times a day (BID) | ORAL | 1 refills | Status: DC | PRN

## 2019-04-16 MED ORDER — VITAMIN D (ERGOCALCIFEROL) 1.25 MG (50000 UNIT) PO CAPS: 50000.0000 [IU] | ORAL_CAPSULE | ORAL | 1 refills | Status: DC

## 2019-04-16 MED ORDER — VITAMIN D (ERGOCALCIFEROL) 1.25 MG (50000 UNIT) PO CAPS: 50000.0000 [IU] | ORAL_CAPSULE | ORAL | 0 refills | Status: DC

## 2019-04-16 NOTE — Progress Notes (Signed)
Virtual Visit via Telephone Note  I connected with Claire Weber on 04/16/19 at  2:03 PM EDT by telephone and verified that I am speaking with the correct person using two identifiers.   Pt location:at home   Physician location: at home, Milinda Antis MD     I discussed the limitations, risks, security and privacy concerns of performing an evaluation and management service by telephone and the availability of in person appointments. I also discussed with the patient that there may be a patient responsible charge related to this service. The patient expressed understanding and agreed to proceed.   History of Present Illness: Televisit to f/u medications. Pt had Telephone visit 3 weeks due to increased depression, insomnia, fatigue, general unwell feeling. Has a lot of stress, long standing history of depression. Lexapro was increased to 20mg  at last visit, continued on xanax which she uses at bedtime to help her sleep. Denies any panic attacks. Her main concern  Is her chronic fatigue, which we have before, based on my evaluation this is multificatorial, she has had labs, which show low vitamin D but b12, cbc, cmet, TSH unremarkable. She is now layed off due to COVID, spends most of day, laying around ,snacking all day, no exercise. 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  Would like to see dermatology   Observations/Objective: No acute distress over phone, normal speech  Assessment and Plan:  referral to dermatology for hair thinning MDD-continue lexapro, xanax at current dose, I think she would benefit from more of a schedule during day, to get her more active, cut down on her snacking from stress and boredom recommended, teletherapy for her- given number for restoration place  couseling Chronic fatigue- can try OTC B12 , in past on shots per report but level not low enough to start this, continue vitamin D Obesity- per above, admits to gaining weight over past month     Follow Up Instructions:    I discussed the assessment and treatment plan with the patient. The patient was provided an opportunity to ask questions and all were answered. The patient agreed with the plan and demonstrated an understanding of the instructions.   The patient was advised to call back or seek an in-person evaluation if the symptoms worsen or if the condition fails to improve as anticipated.  I provided 22 minutes of non-face-to-face time during this encounter. End time: 2:25pm  Milinda Antis, MD

## 2019-07-02 ENCOUNTER — Other Ambulatory Visit: Payer: Self-pay

## 2019-07-02 DIAGNOSIS — Z20822 Contact with and (suspected) exposure to covid-19: Secondary | ICD-10-CM

## 2019-07-07 LAB — NOVEL CORONAVIRUS, NAA: SARS-CoV-2, NAA: NOT DETECTED

## 2019-07-10 ENCOUNTER — Ambulatory Visit: Payer: Medicaid Other | Admitting: Family Medicine

## 2019-08-29 ENCOUNTER — Encounter: Payer: Self-pay | Admitting: Family Medicine

## 2019-08-30 MED ORDER — ESCITALOPRAM OXALATE 20 MG PO TABS: 20.0000 mg | ORAL_TABLET | Freq: Every day | ORAL | 3 refills | Status: DC

## 2019-08-30 NOTE — Telephone Encounter (Signed)
I've sent in refills on her lexapro did not know if you wanted to address her fatigue.

## 2019-09-19 ENCOUNTER — Other Ambulatory Visit: Payer: Self-pay

## 2019-09-19 DIAGNOSIS — Z20822 Contact with and (suspected) exposure to covid-19: Secondary | ICD-10-CM

## 2019-09-20 LAB — NOVEL CORONAVIRUS, NAA: SARS-CoV-2, NAA: NOT DETECTED

## 2019-10-24 DIAGNOSIS — Z124 Encounter for screening for malignant neoplasm of cervix: Secondary | ICD-10-CM | POA: Diagnosis not present

## 2019-10-24 DIAGNOSIS — Z01419 Encounter for gynecological examination (general) (routine) without abnormal findings: Secondary | ICD-10-CM | POA: Diagnosis not present

## 2019-10-24 DIAGNOSIS — R6882 Decreased libido: Secondary | ICD-10-CM | POA: Diagnosis not present

## 2019-10-24 DIAGNOSIS — Z6841 Body Mass Index (BMI) 40.0 and over, adult: Secondary | ICD-10-CM | POA: Diagnosis not present

## 2019-10-24 DIAGNOSIS — Z1151 Encounter for screening for human papillomavirus (HPV): Secondary | ICD-10-CM | POA: Diagnosis not present

## 2019-10-24 LAB — HM PAP SMEAR: HM Pap smear: NEGATIVE

## 2019-12-03 ENCOUNTER — Other Ambulatory Visit: Payer: Self-pay | Admitting: Family Medicine

## 2019-12-03 NOTE — Telephone Encounter (Signed)
Ok to refill??  Last office visit 04/16/2019.  Last refill 04/16/2019, #1 refill.

## 2019-12-04 ENCOUNTER — Other Ambulatory Visit: Payer: Self-pay

## 2019-12-04 DIAGNOSIS — Z20822 Contact with and (suspected) exposure to covid-19: Secondary | ICD-10-CM

## 2019-12-05 LAB — NOVEL CORONAVIRUS, NAA: SARS-CoV-2, NAA: NOT DETECTED

## 2019-12-06 ENCOUNTER — Encounter: Payer: Self-pay | Admitting: Family Medicine

## 2019-12-07 NOTE — Telephone Encounter (Signed)
Schedule OV for patient, needs to come in

## 2019-12-17 ENCOUNTER — Encounter (INDEPENDENT_AMBULATORY_CARE_PROVIDER_SITE_OTHER): Payer: Self-pay | Admitting: Family Medicine

## 2019-12-17 ENCOUNTER — Other Ambulatory Visit: Payer: Self-pay

## 2019-12-17 ENCOUNTER — Encounter (INDEPENDENT_AMBULATORY_CARE_PROVIDER_SITE_OTHER): Payer: BC Managed Care – PPO | Admitting: Family Medicine

## 2019-12-17 ENCOUNTER — Encounter (INDEPENDENT_AMBULATORY_CARE_PROVIDER_SITE_OTHER): Payer: Self-pay

## 2019-12-23 ENCOUNTER — Telehealth: Payer: Self-pay | Admitting: Family Medicine

## 2019-12-23 ENCOUNTER — Other Ambulatory Visit: Payer: Self-pay

## 2019-12-23 ENCOUNTER — Ambulatory Visit (INDEPENDENT_AMBULATORY_CARE_PROVIDER_SITE_OTHER): Payer: BC Managed Care – PPO | Admitting: Family Medicine

## 2019-12-23 ENCOUNTER — Encounter: Payer: Self-pay | Admitting: Family Medicine

## 2019-12-23 DIAGNOSIS — F411 Generalized anxiety disorder: Secondary | ICD-10-CM

## 2019-12-23 DIAGNOSIS — F5104 Psychophysiologic insomnia: Secondary | ICD-10-CM

## 2019-12-23 DIAGNOSIS — Z6841 Body Mass Index (BMI) 40.0 and over, adult: Secondary | ICD-10-CM

## 2019-12-23 DIAGNOSIS — E559 Vitamin D deficiency, unspecified: Secondary | ICD-10-CM

## 2019-12-23 DIAGNOSIS — F331 Major depressive disorder, recurrent, moderate: Secondary | ICD-10-CM

## 2019-12-23 DIAGNOSIS — L659 Nonscarring hair loss, unspecified: Secondary | ICD-10-CM

## 2019-12-23 MED ORDER — BUPROPION HCL ER (SR) 100 MG PO TB12: 100.0000 mg | ORAL_TABLET | Freq: Two times a day (BID) | ORAL | 1 refills | Status: DC

## 2019-12-23 NOTE — Telephone Encounter (Signed)
Patient forgot to ask dr Claire Weber a question today about one of her medications  Would like a call back at [phone]

## 2019-12-23 NOTE — Patient Instructions (Addendum)
F/U 2 weeks  REferral to therapy  Referral to psychotherapy  Call your HR person about your short term disability

## 2019-12-23 NOTE — Telephone Encounter (Signed)
Call placed to patient. LMTRC.  

## 2019-12-23 NOTE — Progress Notes (Signed)
Subjective:    Patient ID: Claire Weber, female    DOB: [birth year], 33 y.o.   MRN: [id number]  Patient presents for Medication Review/ Refill (is fasting), Weight gain, and Hair Loss  Patient here with ongoing issues with concerns to her weight gain and fatigue.  She has been on treatment for depression.  She was tapered off of Zoloft at the beginning of the year and transitioned to Lexapro.  With regards to her general feeling of unwell insomnia and depression fatigue I thought this was multifactorial which we discussed last back in April 2020.  She did have labs at that time which showed low vitamin B but otherwise labs were unremarkable including TSH. She was initially going to the wellness clinic for weight but did not continue I recommended that she start vitamin D supplementation and also try OTC B12  Note in the past was on effexor/ contrave /and Zoloft but she does admit that she was not consistent with these medications.  She has been consistent with her Lexapro recently but is still very overwhelmed and suffering with depression.  States that she feels like she can handle her situation and a single parent.  Her father's child is trying to get back in relationship with her but she does not want to pursue that at this time.  She is having problems with her father they have not getting along.  She has a very stressful job has not been able to take any vacation because they are always short staffed.  She is not sleeping well.  She has been emotionally eating and has gained 20+ pounds since the beginning of January.  Her other major concern is that her hair thinning has worsened significantly where it is broken off in the back and she now has bald spots.   Review Of Systems:  GEN- denies fatigue, fever, weight loss,weakness, recent illness HEENT- denies eye drainage, change in vision, nasal discharge, CVS- denies chest pain, palpitations RESP- denies SOB, cough, wheeze ABD- denies N/V,  change in stools, abd pain GU- denies dysuria, hematuria, dribbling, incontinence MSK- denies joint pain, muscle aches, injury Neuro- denies headache, dizziness, syncope, seizure activity       Objective:    BP 128/64   Pulse 96   Temp 98.6 F (37 C) (Temporal)   Resp 16   Ht 5\' 3"  (1.6 m)   Wt 256 lb (116.1 kg)   SpO2 99%   BMI 45.35 kg/m  GEN- NAD, alert and oriented x3 HEENT- PERRL, EOMI, non injected sclera, pink conjunctiva, MMM  Neck- Supple, no thyromegaly CVS- RRR, no murmur RESP-CTAB ABD-NABS,soft,NT,ND Psych depressed affect tearful  Entire visit no suicidal ideation, not anxiousappearing, well groomed, fair eye contact Skin- thinning hair with broken hair follicules in occiput, flaky skin on scalp  EXT- No edema Pulses- Radial, 2+        Assessment & Plan:      Problem List Items Addressed This Visit      Unprioritized   Chronic insomnia   Relevant Orders   Ambulatory referral to Psychiatry   Hair loss    Referral to dermatology, unclear cause , labs, and thyroid normal I do feel stress is contributing      Relevant Orders   CBC with Differential (Completed)   Comprehensive metabolic panel (Completed)   TSH (Completed)   Ambulatory referral to Dermatology   MDD (major depressive disorder)    Major depression which has been progressive.  Unfortunately with significant  stressors at her job and in the home setting.  I think that she would benefit from some time out of work she is willing to see psychotherapy but needs time to do so.  Also want to start her on Wellbutrin as this may help with overall energy/mood and  is weight neutral.   At this time would add it as an adjunct to her Lexapro with plans to try to decrease the Lexapro down to 10 mg in 2 weeks.  She still has alprazolam as needed. She is not sleeping well on top of everything else and stress eating leading to significant amount of weight gain F/U in a couple of weeks      Relevant  Medications   buPROPion (WELLBUTRIN SR) 100 MG 12 hr tablet   Other Relevant Orders   Ambulatory referral to Psychiatry   Obesity   Relevant Orders   CBC with Differential (Completed)   Comprehensive metabolic panel (Completed)   Hemoglobin A1c (Completed)   Lipid Panel (Completed)   Vitamin D deficiency   Relevant Orders   Vitamin D, 25-hydroxy (Completed)      Note: This dictation was prepared with Dragon dictation along with smaller phrase technology. Any transcriptional errors that result from this process are unintentional.

## 2019-12-23 NOTE — Assessment & Plan Note (Addendum)
Referral to dermatology, unclear cause , labs, and thyroid normal I do feel stress is contributing

## 2019-12-23 NOTE — Assessment & Plan Note (Addendum)
Major depression which has been progressive.  Unfortunately with significant stressors at her job and in the home setting.  I think that she would benefit from some time out of work she is willing to see psychotherapy but needs time to do so.  Also want to start her on Wellbutrin as this may help with overall energy/mood and  is weight neutral.   At this time would add it as an adjunct to her Lexapro with plans to try to decrease the Lexapro down to 10 mg in 2 weeks.  She still has alprazolam as needed. She is not sleeping well on top of everything else and stress eating leading to significant amount of weight gain F/U in a couple of weeks

## 2019-12-24 ENCOUNTER — Encounter: Payer: Self-pay | Admitting: Family Medicine

## 2019-12-24 ENCOUNTER — Encounter (INDEPENDENT_AMBULATORY_CARE_PROVIDER_SITE_OTHER): Payer: Self-pay | Admitting: Family Medicine

## 2019-12-24 LAB — TSH: TSH: 0.82 mIU/L

## 2019-12-24 LAB — CBC WITH DIFFERENTIAL/PLATELET
Absolute Monocytes: 485 cells/uL (ref 200–950)
Basophils Absolute: 23 cells/uL (ref 0–200)
Basophils Relative: 0.4 %
Eosinophils Absolute: 91 cells/uL (ref 15–500)
Eosinophils Relative: 1.6 %
HCT: 41.2 % (ref 35.0–45.0)
Hemoglobin: 14.1 g/dL (ref 11.7–15.5)
Lymphs Abs: 1835 cells/uL (ref 850–3900)
MCH: 31.7 pg (ref 27.0–33.0)
MCHC: 34.2 g/dL (ref 32.0–36.0)
MCV: 92.6 fL (ref 80.0–100.0)
MPV: 11.3 fL (ref 7.5–12.5)
Monocytes Relative: 8.5 %
Neutro Abs: 3266 cells/uL (ref 1500–7800)
Neutrophils Relative %: 57.3 %
Platelets: 253 10*3/uL (ref 140–400)
RBC: 4.45 10*6/uL (ref 3.80–5.10)
RDW: 12 % (ref 11.0–15.0)
Total Lymphocyte: 32.2 %
WBC: 5.7 10*3/uL (ref 3.8–10.8)

## 2019-12-24 LAB — HEMOGLOBIN A1C
Hgb A1c MFr Bld: 5.3 % of total Hgb (ref <5.7)
Mean Plasma Glucose: 105 (calc)
eAG (mmol/L): 5.8 (calc)

## 2019-12-24 LAB — COMPREHENSIVE METABOLIC PANEL
AG Ratio: 1.3 (calc) (ref 1.0–2.5)
ALT: 12 U/L (ref 6–29)
AST: 16 U/L (ref 10–30)
Albumin: 3.9 g/dL (ref 3.6–5.1)
Alkaline phosphatase (APISO): 94 U/L (ref 31–125)
BUN: 12 mg/dL (ref 7–25)
CO2: 25 mmol/L (ref 20–32)
Calcium: 9.2 mg/dL (ref 8.6–10.2)
Chloride: 107 mmol/L (ref 98–110)
Creat: 0.87 mg/dL (ref 0.50–1.10)
Globulin: 2.9 g/dL (calc) (ref 1.9–3.7)
Glucose, Bld: 94 mg/dL (ref 65–99)
Potassium: 4.2 mmol/L (ref 3.5–5.3)
Sodium: 141 mmol/L (ref 135–146)
Total Bilirubin: 0.7 mg/dL (ref 0.2–1.2)
Total Protein: 6.8 g/dL (ref 6.1–8.1)

## 2019-12-24 LAB — VITAMIN D 25 HYDROXY (VIT D DEFICIENCY, FRACTURES): Vit D, 25-Hydroxy: 28 ng/mL — ABNORMAL LOW (ref 30–100)

## 2019-12-24 LAB — LIPID PANEL
Cholesterol: 145 mg/dL (ref <200)
HDL: 39 mg/dL — ABNORMAL LOW (ref >=50)
LDL Cholesterol (Calc): 91 mg/dL (calc)
Non-HDL Cholesterol (Calc): 106 mg/dL (calc) (ref <130)
Total CHOL/HDL Ratio: 3.7 (calc) (ref <5.0)
Triglycerides: 60 mg/dL (ref <150)

## 2019-12-24 MED ORDER — ALPRAZOLAM 0.5 MG PO TABS: 0.5000 mg | ORAL_TABLET | Freq: Two times a day (BID) | ORAL | 1 refills | Status: DC | PRN

## 2019-12-24 NOTE — Progress Notes (Signed)
ERROR

## 2019-12-25 ENCOUNTER — Telehealth: Payer: Self-pay | Admitting: *Deleted

## 2019-12-25 DIAGNOSIS — F411 Generalized anxiety disorder: Secondary | ICD-10-CM | POA: Insufficient documentation

## 2019-12-25 NOTE — Telephone Encounter (Signed)
Late documentation for 12/23/2019:  Received call from patient. Reports that she is to begin a program for her weight. States that PCP prescribed medication to assist with weight loss. Inquired as to if she should begin medication prior to program.   Per chart review, patient was started on Wellbutrin for mood/ depression, and with the hopes that it would decrease stress eating. Advised to begin medication and taper Lexapro as directed.

## 2019-12-25 NOTE — Telephone Encounter (Signed)
Call placed to patient to inquire. [employer].   Patient returned call ans states that she was originally only going to take 2 weeks leave, but with provider recommendations of 4 weeks, she will extend. Patient reports that she does have short term disability and form should have been faxed to office.   Also requested letter for employer: [employer]  Dr. Gorden Harms [phone]~ telephone [phone]~fax  MD please advise.

## 2019-12-25 NOTE — Telephone Encounter (Signed)
Forms completed

## 2019-12-25 NOTE — Telephone Encounter (Signed)
-----   Message from Alycia Rossetti, MD sent at 12/25/2019  7:40 AM EST ----- Regarding: Can you check [name] box, I beleive I sent the results to her for this pt   I need to clarify the dates of there leave,

## 2019-12-26 NOTE — Telephone Encounter (Signed)
Forms and letter faxed to patient work.

## 2019-12-30 ENCOUNTER — Encounter: Payer: Self-pay | Admitting: Family Medicine

## 2019-12-31 ENCOUNTER — Encounter: Payer: Self-pay | Admitting: Family Medicine

## 2020-01-02 ENCOUNTER — Other Ambulatory Visit: Payer: Self-pay

## 2020-01-02 ENCOUNTER — Encounter (INDEPENDENT_AMBULATORY_CARE_PROVIDER_SITE_OTHER): Payer: Self-pay | Admitting: Family Medicine

## 2020-01-02 ENCOUNTER — Telehealth: Payer: Self-pay | Admitting: Family Medicine

## 2020-01-02 ENCOUNTER — Ambulatory Visit (INDEPENDENT_AMBULATORY_CARE_PROVIDER_SITE_OTHER): Payer: BC Managed Care – PPO | Admitting: Family Medicine

## 2020-01-02 VITALS — BP 121/81 | HR 76 | Temp 98.0°F | Ht 63.0 in | Wt 255.0 lb

## 2020-01-02 DIAGNOSIS — E559 Vitamin D deficiency, unspecified: Secondary | ICD-10-CM

## 2020-01-02 DIAGNOSIS — Z6841 Body Mass Index (BMI) 40.0 and over, adult: Secondary | ICD-10-CM

## 2020-01-02 DIAGNOSIS — F418 Other specified anxiety disorders: Secondary | ICD-10-CM | POA: Diagnosis not present

## 2020-01-02 DIAGNOSIS — R5383 Other fatigue: Secondary | ICD-10-CM

## 2020-01-02 DIAGNOSIS — Z9189 Other specified personal risk factors, not elsewhere classified: Secondary | ICD-10-CM

## 2020-01-02 DIAGNOSIS — R0602 Shortness of breath: Secondary | ICD-10-CM

## 2020-01-02 MED ORDER — VITAMIN D (ERGOCALCIFEROL) 1.25 MG (50000 UNIT) PO CAPS: 50000.0000 [IU] | ORAL_CAPSULE | ORAL | 0 refills | Status: DC

## 2020-01-02 NOTE — Telephone Encounter (Signed)
Awaiting forms

## 2020-01-02 NOTE — Telephone Encounter (Signed)
Received [employer] paperwork via fax on 01/02/2020 Will route to christina for completion

## 2020-01-02 NOTE — Progress Notes (Signed)
Office: [phone]  /  Fax: [phone]    Date: January 16, 2020  Time Seen: 11:59am Duration: 51 minutes Provider: Glennie Isle, PsyD Type of Session: Intake for Individual Therapy  Type of Contact: Face-to-face  Informed Consent for In-Person Services During COVID-19: During today's appointment, information about the decision to initiate in-person services in light of the QIWLN-98 public health crisis was discussed. Claire Weber and this provider agreed to meet in person for some or all future appointments. If there is a resurgence of the pandemic or other health concerns arise, telepsychological services may be initiated and any related concerns will be discussed and an attempt to address them will be made. Claire Weber verbally acknowledged understanding that if necessary, this provider may determine there is a need to initiate telepsychological services for everyone's well-being. Claire Weber expressed understanding she may request to initiate telepsychological services, and that request will be respected as long as it is feasible and clinically appropriate. The risks for opting for in-person services was discussed. Claire Weber verbally acknowledged understanding that by coming to the office, she is assuming the risk of exposure to the coronavirus or other public risk, and the risk may increase if Cintia travels by public transportation, cab, or [employer]. To obtain in-person services, Claire Weber verbally agreed to taking certain precautions (e.g., screening prior to appointment; universal masking; social distancing of 6 feet; proper hand hygiene) set forth by [facility] [facility] to keep everyone safe from exposure and subsequent consequences. This information was shared by front desk staff either at the time of scheduling and/or during the check-in process. Claire Weber expressed understanding that should she not adhere to these safeguards, it may result in starting/returning to a telepsychological service arrangement and/or the  exploration of other options for treatment. Claire Weber acknowledged understanding that [facility] will follow the protocol set forth by [facility] should a patient present with a fever or other symptoms or disclose recent exposure, which will include rescheduling the appointment. Furthermore, Claire Weber acknowledged understanding that precautions may change if additional local, state or federal orders or guidelines are published.This provider also shared that if Claire Weber tests positive for the coronavirus and was in the [facility] clinic, this provider will follow [place]'s disclosure policy. Similarly, this provider will follow [place] [facility]'s disclosure policy should this provider or staff test positive for the coronavirus. To avoid handling of paper/writing instruments and increasing likelihood of touching, verbal consent was obtained by Claire Weber during today's appointment prior to proceeding. Claire Weber provided verbal consent to proceed, and acknowledged understanding that by verbally consenting to proceed, she is agreeable to all information noted above.   Informed Consent: The provider's role was explained to [employer]. The provider reviewed and discussed issues of confidentiality, privacy, and limits therein (e.g., reporting obligations). In addition to verbal informed consent, written informed consent for psychological services was obtained prior to the initial appointment. Since the clinic is not a 24/7 crisis center, mental health emergency resources were shared and this  provider explained MyChart, e-mail, voicemail, and/or other messaging systems should be utilized only for non-emergency reasons. This provider also explained that information obtained during appointments will be placed in Dalyn's medical record and relevant information will be shared with other providers at [facility] for coordination of care. Moreover, Daquana agreed information may be shared with other [facility] providers as needed for coordination of care. By signing the service agreement document, Claire Weber provided written consent for coordination of care. Claire Weber also  verbally acknowledged understanding she is ultimately responsible for understanding her insurance benefits for services. Claire Weber  acknowledged understanding that appointments cannot be recorded without both party consent. Claire Weber verbally consented to proceed.  Chief Complaint/HPI: Claire Weber was referred by Dr. Dennard Nip due to depression with anxiety. Per the note for the initial visit with Dr. Dennard Nip on January 02, 2020, "Claire Weber is on Wellbutrin, Lexapro, and Xanax. She is getting ready to discontinue Lexapro per her primary care physician. She does a lot of emotional eating and has a lot of stressors in her life." During the initial appointment, Claire Weber reported experiencing the following: significant food cravings issues , snacking frequently in the evenings, frequently drinking liquids with calories, frequently eating larger portions than normal , struggling with emotional eating and skipping meals frequently. Claire Weber's Food and Mood (modified PHQ-9) score on January 02, 2020 was 21.  During today's appointment, Claire Weber was verbally administered a questionnaire assessing various behaviors related to emotional eating. Claire Weber endorsed the following: overeat when you are celebrating, experience food cravings on a regular basis, eat certain foods when you are anxious, stressed, depressed, or your feelings are hurt, use food to help you cope with emotional situations, find food is comforting to you, overeat when you are angry or upset, overeat when you are worried about something, overeat frequently when you are bored or lonely, not worry about what you eat when you are in a good mood, overeat when you are angry at someone just to show them they cannot control you and eat as a reward. She shared she craves carbohydrates, adding she often snacks. Claire Weber  believes the onset of emotional eating was likely a few years ago, noting, "I worry a lot about everything." She indicated she began gaining weight last year and recalled different stressors. She described the current frequency of emotional eating as "once a day." In addition, Claire Weber endorsed a history of binge eating. This was explored. Claire Weber recalled once eating "an enormous burger" resulting in feeling uncomfortable. She denied a history of restricting food intake and purging, but stated she took a laxative 2-3 weeks ago for weight loss. Thus, brief psychoeducation regarding the consequences of laxative use for weight loss was provided. She denied ever being diagnosed with an eating disorder and has never been treated for emotional eating. Moreover, Claire Weber indicated stress and boredom triggers emotional eating, whereas calling a friend makes emotional eating better. Furthermore, Claire Weber denied other problems of concern.    Mental Status Examination:  Appearance: well groomed and appropriate hygiene  Behavior: appropriate to circumstances Mood: anxious Affect: mood congruent Speech: normal in rate, volume, and tone Eye Contact: appropriate Psychomotor Activity: appropriate Gait: normal Thought Process: linear, logical, and goal directed  Thought Content/Perception: denies suicidal and homicidal ideation, plan, and intent and no hallucinations, delusions, bizarre thinking or behavior reported or observed Orientation: time, person, place and purpose of appointment Memory/Concentration: memory, attention, language, and fund of knowledge intact  Insight/Judgment: good  Family & Psychosocial History: Claire Weber reported she is in a relationship and has one daughter (age 74). 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 Additionally, Claire Weber shared her highest level of education obtained is a high school diploma and some college. Currently, Claire Weber's social support system consists  of her mother and friend. Moreover, Eliabeth stated she resides with her daughter.   Medical History:  Past Medical History:  Diagnosis Date  . Anxiety   . Depression   . Medical history non-contributory   .  Vitamin D deficiency    Past Surgical History:  Procedure Laterality Date  . ABDOMINAL HERNIA REPAIR     at age 42   Current Outpatient Medications on File Prior to Visit  Medication Sig Dispense Refill  . ALPRAZolam (XANAX) 0.5 MG tablet Take 1 tablet (0.5 mg total) by mouth 2 (two) times daily as needed. for anxiety 30 tablet 1  . buPROPion (WELLBUTRIN SR) 100 MG 12 hr tablet Take 1 tablet (100 mg total) by mouth 2 (two) times daily. 60 tablet 1  . escitalopram (LEXAPRO) 10 MG tablet Take 1 tablet (10 mg total) by mouth at bedtime. 30 tablet 1  . levonorgestrel (MIRENA) 20 MCG/24HR IUD 1 each by Intrauterine route once.    . Vitamin D, Ergocalciferol, (DRISDOL) 1.25 MG (50000 UT) CAPS capsule Take 1 capsule (50,000 Units total) by mouth every 7 (seven) days. 4 capsule 0   No current facility-administered medications on file prior to visit.  Carletta denied a history of head injuries and loss of consciousness.   Mental Health History: Amoura reported she attended therapeutic services in 2019 for four appointments to address anxiety and depression. She indicated she is scheduled for an initial appointment on January 21, 2020 at 1:00pm with [facility] based on a referral placed by her PCP. Rhianne reported there is no history of hospitalizations for psychiatric concerns, and has never met with a psychiatrist. Charda stated her PCP currently prescribes Lexapro, Wellbutrin, and Xanax. She described Lexapro as helpful, noting Wellbutrin and Xanax are "new." Keidra denied a family history of mental health related concerns. Julisa reported there is no history of trauma including psychological, physical  and sexual abuse, as well as neglect.   Teiana described her typical mood lately as "kind of  down." Aside from concerns noted above and endorsed on the PHQ-9 and GAD-7, Lynley reported experiencing crying spells; decreased motivation; worry thoughts about her relationship, parents' health, and work; social withdrawal due to weight; and hopelessness about her weight loss and career. She also disclosed a history of angry outbursts that were physical (e.g., throwing objects) and verbal in nature toward her daughter's father. She denied a history of domestic violence and noted her daughter was not present during those outbursts. Arely endorsed current alcohol use, noting she "occasionally" has a standard pour of wine. She denied tobacco use and illicit/recreational substance use. Regarding caffeine intake, [place] reported consuming 32 oz. of sweet tea daily; however, she indicated she is working toward reducing intake. She also stated she is consuming two diet sodas daily since starting with the clinic. Furthermore, Domnique indicated she is not experiencing the following: obsessions and compulsions, hallucinations and delusions, paranoia, symptoms of mania (e.g., expansive mood, flighty ideas, decreased need for sleep, engagement in risky behaviors) and panic attacks. She also denied history of and current suicidal ideation, plan, and intent; history of and current homicidal ideation, plan, and intent; and history of and current engagement in self-harm.  The following strengths were reported by Claire Weber: people person, great communication skills, and organization. The following strengths were observed by this provider: ability to express thoughts and feelings during the therapeutic session, ability to establish and benefit from a therapeutic relationship, willingness to work toward established goal(s) with the clinic and ability to engage in reciprocal conversation.  Legal History: Allyson reported there is no history of legal involvement.   Structured Assessments Results: The Patient Health Questionnaire-9 (PHQ-9) is a  self-report measure that assesses symptoms and severity of depression over  the course of the last two weeks. Domino obtained a score of 8 suggesting mild depression. Jentri finds the endorsed symptoms to be extremely difficult. [0= Not at all; 1= Several days; 2= More than half the days; 3= Nearly every day] Little interest or pleasure in doing things 1  Feeling down, depressed, or hopeless 1  Trouble falling or staying asleep, or sleeping too much 2  Feeling tired or having little energy 1  Poor appetite or overeating 1  Feeling bad about yourself --- or that you are a failure or have let yourself or your family down 1  Trouble concentrating on things, such as reading the newspaper or watching television 1  Moving or speaking so slowly that other people could have noticed? Or the opposite --- being so fidgety or restless that you have been moving around a lot more than usual 0  Thoughts that you would be better off dead or hurting yourself in some way 0  PHQ-9 Score 8    The Generalized Anxiety Disorder-7 (GAD-7) is a brief self-report measure that assesses symptoms of anxiety over the course of the last two weeks. Turner obtained a score of 8 suggesting mild anxiety. Albertia finds the endorsed symptoms to be very difficult. [0= Not at all; 1= Several days; 2= Over half the days; 3= Nearly every day] Feeling nervous, anxious, on edge 2  Not being able to stop or control worrying 1  Worrying too much about different things 1  Trouble relaxing 1  Being so restless that it's hard to sit still 0  Becoming easily annoyed or irritable 1  Feeling afraid as if something awful might happen 2  GAD-7 Score 8   Interventions:  Conducted a chart review Verbally administered PHQ-9 and GAD-7 for symptom monitoring Verbally administered Food & Mood questionnaire to assess various behaviors related to emotional eating. Provided emphatic reflections and validation Recommended/discussed option for longer-term  therapeutic services Psychoeducation provided regarding physical versus emotional hunger  Psychoeducation provided regarding laxative use for weight loss  Provisional DSM-5 Diagnosis: 311 (F32.8) Other Specified Depressive Disorder, Emotional Eating Behaviors  Plan: Based on current symptomatology as well as financial concerns shared by Jeanell Sparrow will attend her initial appointment with [facility] as planned. She acknowledged understanding that she may request a follow-up appointment with this provider in the future as long as she is still established with the clinic. Zhavia was provided a handout to increase awareness of hunger patterns and subsequent eating. No further follow-up planned by this provider.

## 2020-01-03 NOTE — Telephone Encounter (Signed)
noted 

## 2020-01-03 NOTE — Telephone Encounter (Signed)
Received forms from [place] [employer] [place].  Per patient MyChart, incorrect forms were sent to provider to complete.   Forwarded to PCP.

## 2020-01-07 ENCOUNTER — Other Ambulatory Visit: Payer: Self-pay

## 2020-01-07 ENCOUNTER — Ambulatory Visit (INDEPENDENT_AMBULATORY_CARE_PROVIDER_SITE_OTHER): Payer: BC Managed Care – PPO | Admitting: Family Medicine

## 2020-01-07 ENCOUNTER — Encounter: Payer: Self-pay | Admitting: Family Medicine

## 2020-01-07 VITALS — BP 122/80 | HR 82 | Temp 98.1°F | Resp 14 | Ht 63.0 in | Wt 255.0 lb

## 2020-01-07 DIAGNOSIS — F411 Generalized anxiety disorder: Secondary | ICD-10-CM | POA: Diagnosis not present

## 2020-01-07 DIAGNOSIS — F5104 Psychophysiologic insomnia: Secondary | ICD-10-CM

## 2020-01-07 DIAGNOSIS — F331 Major depressive disorder, recurrent, moderate: Secondary | ICD-10-CM | POA: Diagnosis not present

## 2020-01-07 MED ORDER — ESCITALOPRAM OXALATE 10 MG PO TABS: 10.0000 mg | ORAL_TABLET | Freq: Every day | ORAL | 1 refills | Status: DC

## 2020-01-07 NOTE — Progress Notes (Signed)
Subjective:    Patient ID: Claire Weber, female    DOB: October 27, 1986, 34 y.o.   MRN: [id number]  Patient presents for Follow-up (is fasting) Patient here for interim follow-up on her major depression anxiety insomnia.  She is currently on medical leave because of her increased stressors and difficulty performing her job.  She was referred for psychotherapy at her last visit.  She was also started on Wellbutrin 100 mg twice a day as she was already on Lexapro.  Was to decrease her Lexapro down to 10 mg today. She has been in contact with psychotherapist  She was seen by the weight and wellness visit clinic last week, given a new nutrition plan to follow, but she feels overwhelmed  With regards to the medication.  She can see a little improvement with the Wellbutrin but states that she is still very stressed out.  States that her other that her job keep texting her asking her what is wrong she does not know what to tell them.  She gets very frustrated think they are talking about her and think that she should just go back to work even though this will cause her even more stress.  She also has family members of her father's child that have been "spying on her" and she has difficulty dealing with them. She is very worried about what other people think about her and her mental state.  Not sleeping well, feels like she has a lot of energey at night, difficulty winding down, she was in the bed, but didn't fall asleep until 1am.  She has not tried the alprazolam.  She still has difficulty focusing and concentrating on things.  Just feels like there is a heaviness all around her and stressors all over the place and she cannot figure out which want to tackle first.  Patient also tells me that she decided to cut her hair off and go natural because of the balding areas.  She is still waiting her dermatology appointment. phq score 21 on 1/28 > 19 ( today)  Review Of Systems:  GEN- denies fatigue, fever, weight  loss,weakness, recent illness       Objective:    BP 122/80   Pulse 82   Temp 98.1 F (36.7 C) (Temporal)   Resp 14   Ht 5\' 3"  (1.6 m)   Wt 255 lb (115.7 kg)   SpO2 98%   BMI 45.17 kg/m  GEN- NAD, alert and oriented x3 Psych-depressed affect not overly anxious.  Tearful at times.  Good eye contact she is in relaxed clothing.  PHQ 9 score 19.  No suicidal ideations.        Assessment & Plan:   Approx 30 minutes spent with pt  > 50% on counseling and medication management    Problem List Items Addressed This Visit      Unprioritized   Chronic insomnia - Primary   GAD (generalized anxiety disorder)   Relevant Medications   escitalopram (LEXAPRO) 10 MG tablet   MDD (major depressive disorder)    We will have patient reach back out to her psychotherapist today to get that initial appointment scheduled.  I think that she does need intensive psychotherapy to help her with the multiple angles of stress in her life.  She has difficulty focusing on one task to try to tackle.  She is not sleeping well.  We discussed using the alprazolam at bedtime to help her rest.  She is very weary of  medication.  We will decrease her Lexapro down to 10 mg a day we will have her continue the Wellbutrin 100 mg twice a day since it has been 2 weeks.  She still has another 2 weeks out on her medical leave which I think will be very beneficial to her to try to get some things in order to get her established with the specialist that she needs to see and for medication management.  We discussed taking 1 step at a time trying to organize her day.  With regards to the nutrition her weight is also something that is a significant concern to her and makes her more frustrated.  She was seen by the healthy wellness and obesity management clinic they have given her a nutrition guidelines to follow and she is going to try to follow through with these recommendations      Relevant Medications   escitalopram (LEXAPRO) 10  MG tablet      Note: This dictation was prepared with Dragon dictation along with smaller phrase technology. Any transcriptional errors that result from this process are unintentional.

## 2020-01-07 NOTE — Assessment & Plan Note (Signed)
We will have patient reach back out to her psychotherapist today to get that initial appointment scheduled.  I think that she does need intensive psychotherapy to help her with the multiple angles of stress in her life.  She has difficulty focusing on one task to try to tackle.  She is not sleeping well.  We discussed using the alprazolam at bedtime to help her rest.  She is very weary of medication.  We will decrease her Lexapro down to 10 mg a day we will have her continue the Wellbutrin 100 mg twice a day since it has been 2 weeks.  She still has another 2 weeks out on her medical leave which I think will be very beneficial to her to try to get some things in order to get her established with the specialist that she needs to see and for medication management.  We discussed taking 1 step at a time trying to organize her day.  With regards to the nutrition her weight is also something that is a significant concern to her and makes her more frustrated.  She was seen by the healthy wellness [facility] obesity management clinic they have given her a nutrition guidelines to follow and she is going to try to follow through with these recommendations

## 2020-01-07 NOTE — Patient Instructions (Addendum)
Take wellbutrin in the morning and dinner time Decrease lexapro to 10mg   Use xanax at bedtime for sleep for now  Call the therapist back  Go ahead and start the nutrition  F/U 2 weeks on either Wed/ Friday either Jan 27th/ 29th

## 2020-01-08 NOTE — Progress Notes (Signed)
Chief Complaint:   OBESITY Claire Weber (MR# [id number]) is a 34 y.o. female who presents for evaluation and treatment of obesity and related comorbidities. Current BMI is Body mass index is 45.17 kg/m. Claire Weber has been struggling with her weight for many years and has been unsuccessful in either losing weight, maintaining weight loss, or reaching her healthy weight goal.  Claire Weber was last seen in our clinic approximately 10 months ago before COVID19. She is ready to start over and work on getting to a healthier weight.  Claire Weber states she is currently in the action stage of change and ready to dedicate time achieving and maintaining a healthier weight. Claire Weber is interested in becoming our patient and working on intensive lifestyle modifications including (but not limited to) diet and exercise for weight loss.  Claire Weber's habits were reviewed today and are as follows: Her family eats meals together, she struggles with family and or coworkers weight loss sabotage, her desired weight loss is 75 lbs, she started gaining weight in 2020, her heaviest weight ever was 255 pounds, she has significant food cravings issues, she snacks frequently in the evenings, she skips meals frequently, she is frequently drinking liquids with calories, she frequently eats larger portions than normal and she struggles with emotional eating.  Depression Screen Claire Weber's Food and Mood (modified PHQ-9) score was 21.  Depression screen PHQ 2/9 01/07/2020  Decreased Interest 2  Down, Depressed, Hopeless 3  PHQ - 2 Score 5  Altered sleeping 3  Tired, decreased energy 3  Change in appetite 3  Feeling bad or failure about yourself  2  Trouble concentrating 3  Moving slowly or fidgety/restless 0  Suicidal thoughts 0  PHQ-9 Score 19  Difficult doing work/chores -   Subjective:   1. Other fatigue Claire Weber feels her energy is lower than it should be. This has worsened with weight gain and has not worsened recently. Claire Weber admits to  daytime somnolence and admits to waking up still tired. Patient is at risk for obstructive sleep apnea. Patent has a history of symptoms of daytime fatigue and morning headache. Patient generally gets 6 hours of sleep per night, and states they generally have nightime awakenings. Snoring is present. Apneic episodes are not present. Epworth Sleepiness Score is 9.  2. Shortness of breath on exertion Claire Weber notes increasing shortness of breath with exercising and seems to be worsening over time with weight gain. She notes getting out of breath sooner with activity than she used to. This has not gotten worse recently. Claire Weber denies shortness of breath at rest or orthopnea.  3. Vitamin D deficiency Claire Weber's last Vit D level was not at goal. She denies nausea, vomiting, or muscle weakness.  4. Depression with anxiety Claire Weber is on Wellbutrin, Lexapro, and Xananx. She is getting ready to discontinue Lexapro per her primary care physician. She does a lot of emotional eating and has a lot of stressors in her life.  5. At risk for heart disease Claire Weber is at a higher than average risk for cardiovascular disease due to obesity. Reviewed: no chest pain on exertion, no dyspnea on exertion, and no swelling of ankles.  Assessment/Plan:   1. Other fatigue Susa was informed fatigue may be related to obesity, depression or many other causes. Labs will be ordered today, and in the meanwhile Myda has agreed to work on diet, exercise and weight loss.  - EKG 12-Lead  2. Shortness of breath on exertion Claire Weber's shortness of breath appears to  be obesity related and exercise induced. She has agreed to work on weight loss and gradually increase exercise to treat her exercise induced shortness of breath. We will continue to monitor closely.  3. Vitamin D deficiency Low Vitamin D level contributes to fatigue and are associated with obesity, breast, and colon cancer. Claire Weber agrees to start prescription Vitamin D and will follow-up for  routine testing of vitamin D, at least 2-3 times per year to avoid over-replacement. We will recheck labs in 3 months.  - Vitamin D, Ergocalciferol, (DRISDOL) 1.25 MG (50000 UT) CAPS capsule; Take 1 capsule (50,000 Units total) by mouth every 7 (seven) days.  Dispense: 4 capsule; Refill: 0  4. Depression with anxiety Behavior modification techniques were discussed today to help Claire Weber deal with her emotional/non-hunger eating behaviors.  Orders and follow up as documented in patient record. We will refer to Dr. Dewaine Conger, our Bariatric Psychologist for evaluation.  5. At risk for heart disease Claire Weber was given approximately 30 minutes of coronary artery disease prevention counseling today. She is 34 y.o. female and has risk factors for heart disease including obesity. We discussed intensive lifestyle modifications today with an emphasis on specific weight loss instructions and strategies.   6. Class 3 severe obesity with serious comorbidity and body mass index (BMI) of 45.0 to 49.9 in adult, unspecified obesity type (HCC) Claire Weber is currently in the action stage of change and her goal is to continue with weight loss efforts. I recommend Claire Weber begin the structured treatment plan as follows:  She has agreed to on the Category 2 Plan.  We discussed the following exercise goals today: For substantial health benefits, adults should do at least 150 minutes (2 hours and 30 minutes) a week of moderate-intensity, or 75 minutes (1 hour and 15 minutes) a week of vigorous-intensity aerobic physical activity, or an equivalent combination of moderate- and vigorous-intensity aerobic activity. Aerobic activity should be performed in episodes of at least 10 minutes, and preferably, it should be spread throughout the week. Adults should also include muscle-strengthening activities that involve all major muscle groups on 2 or more days a week. We discussed the following behavioral modification strategies today: meal planning and  cooking strategies and emotional eating strategies.  She was informed of the importance of frequent follow-up visits to maximize her success with intensive lifestyle modifications for her multiple health conditions. She was informed we would discuss her lab results at her next visit unless there is a critical issue that needs to be addressed sooner. Claire Weber agreed to keep her next visit at the agreed upon time to discuss these results.  Objective:   Blood pressure 121/81, pulse 76, temperature 98 F (36.7 C), temperature source Oral, height 5\' 3"  (1.6 m), weight 255 lb (115.7 kg), SpO2 100 %. Body mass index is 45.17 kg/m.  EKG: Normal sinus rhythm, rate 75 BPM.  Indirect Calorimeter completed today shows a VO2 of 241 and a REE of 1679.  Her calculated basal metabolic rate is thus her basal metabolic rate is worse than expected.  General: Cooperative, alert, well developed, in no acute distress. HEENT: Conjunctivae and lids unremarkable. Neck: No thyromegaly.  Cardiovascular: Regular rhythm.  Lungs: Normal work of breathing. Extremities: No edema.  Neurologic: No focal deficits.   Lab Results  Component Value Date   CREATININE 0.87 12/23/2019   BUN 12 12/23/2019   NA 141 12/23/2019   K 4.2 12/23/2019   CL 107 12/23/2019   CO2 25 12/23/2019   Lab  Results  Component Value Date   ALT 12 12/23/2019   AST 16 12/23/2019   ALKPHOS 104 01/09/2019   BILITOT 0.7 12/23/2019   Lab Results  Component Value Date   HGBA1C 5.3 12/23/2019   HGBA1C 5.2 01/09/2019   Lab Results  Component Value Date   INSULIN 3.2 01/09/2019   Lab Results  Component Value Date   TSH 0.82 12/23/2019   Lab Results  Component Value Date   CHOL 145 12/23/2019   HDL 39 (L) 12/23/2019   LDLCALC 91 12/23/2019   TRIG 60 12/23/2019   CHOLHDL 3.7 12/23/2019   Lab Results  Component Value Date   WBC 5.7 12/23/2019   HGB 14.1 12/23/2019   HCT 41.2 12/23/2019   MCV 92.6 12/23/2019   PLT 253  12/23/2019   No results found for: IRON, TIBC, FERRITIN  Attestation Statements:   Reviewed by clinician on day of visit: allergies, medications, problem list, medical history, surgical history, family history, social history, and previous encounter notes.   I, Trixie Dredge, am acting as transcriptionist for Dennard Nip, MD.  I have reviewed the above documentation for accuracy and completeness, and I agree with the above. - Dennard Nip, MD

## 2020-01-15 ENCOUNTER — Encounter: Payer: Self-pay | Admitting: Family Medicine

## 2020-01-16 ENCOUNTER — Ambulatory Visit (INDEPENDENT_AMBULATORY_CARE_PROVIDER_SITE_OTHER): Payer: BC Managed Care – PPO | Admitting: Family Medicine

## 2020-01-16 ENCOUNTER — Other Ambulatory Visit: Payer: Self-pay

## 2020-01-16 ENCOUNTER — Encounter (INDEPENDENT_AMBULATORY_CARE_PROVIDER_SITE_OTHER): Payer: Self-pay | Admitting: Family Medicine

## 2020-01-16 ENCOUNTER — Ambulatory Visit (INDEPENDENT_AMBULATORY_CARE_PROVIDER_SITE_OTHER): Payer: BC Managed Care – PPO | Admitting: Psychology

## 2020-01-16 VITALS — BP 119/81 | HR 79 | Temp 98.2°F | Ht 63.0 in | Wt 251.0 lb

## 2020-01-16 DIAGNOSIS — E860 Dehydration: Secondary | ICD-10-CM | POA: Diagnosis not present

## 2020-01-16 DIAGNOSIS — F418 Other specified anxiety disorders: Secondary | ICD-10-CM

## 2020-01-16 DIAGNOSIS — Z6841 Body Mass Index (BMI) 40.0 and over, adult: Secondary | ICD-10-CM

## 2020-01-16 DIAGNOSIS — Z9189 Other specified personal risk factors, not elsewhere classified: Secondary | ICD-10-CM

## 2020-01-16 DIAGNOSIS — F3289 Other specified depressive episodes: Secondary | ICD-10-CM

## 2020-01-16 DIAGNOSIS — L668 Other cicatricial alopecia: Secondary | ICD-10-CM | POA: Diagnosis not present

## 2020-01-17 ENCOUNTER — Ambulatory Visit: Payer: Medicaid Other | Attending: Family

## 2020-01-17 DIAGNOSIS — Z20822 Contact with and (suspected) exposure to covid-19: Secondary | ICD-10-CM | POA: Insufficient documentation

## 2020-01-18 LAB — NOVEL CORONAVIRUS, NAA: SARS-CoV-2, NAA: NOT DETECTED

## 2020-01-20 NOTE — Progress Notes (Signed)
Chief Complaint:   OBESITY Claire Weber is here to discuss her progress with her obesity treatment plan along with follow-up of her obesity related diagnoses. Claire Weber is on the Category 2 Plan and states she is following her eating plan approximately 40% of the time. Claire Weber states she is doing 0 minutes 0 times per week.  Today's visit was #: 5 Starting weight: 235 lbs Starting date: 01/09/2019 Today's weight: 251 lbs Today's date: 01/16/2020 Total lbs lost to date: 0 Total lbs lost since last in-office visit: 4  Interim History: Claire Weber struggled to follow her plan closely as she has had a lot of stress in her life, but she did make a lot of positive changes such as decreasing eating out and discontinued sweet tea.  Subjective:   1. Dehydration Claire Weber has felt a little lightheaded lately. She has stopped all sweet teas and may not have replaced it with enough water.  2. Depression with anxiety Claire Weber's Lexapro was decreased to 10 mg recently, and she is on Wellbutrin SR 100 mg BID. She saw Dr. Dewaine Conger today and she is planning on following up with a counselor. She notes significant emotional eating.  3. At risk for heart disease Claire Weber is at a higher than average risk for cardiovascular disease due to obesity. Reviewed: no chest pain on exertion, no dyspnea on exertion, and no swelling of ankles.  Assessment/Plan:   1. Dehydration Claire Weber is to increase her water intake or other calorie and caffeine free liquids, and will continue to follow.  2. Depression with anxiety Claire Weber was educated on changes of emotional eating and strategies to avoid comfort or boredom eating. Skyla agrees to continue her medications as is and will follow closely.  3. At risk for heart disease Claire Weber was given approximately 30 minutes of coronary artery disease prevention counseling today. She is 34 y.o. female and has risk factors for heart disease including obesity. We discussed intensive lifestyle modifications today with an  emphasis on specific weight loss instructions and strategies.   Repetitive spaced learning was employed today to elicit superior memory formation and behavioral change.  4. Class 3 severe obesity due to excess calories with serious comorbidity and body mass index (BMI) of 40.0 to 44.9 in adult [facility]) Claire Weber is currently in the action stage of change. As such, her goal is to continue with weight loss efforts. She has agreed to the Category 2 Plan with additional lunch options. We discussed dinner ideas.   Behavioral modification strategies: increasing lean protein intake, increasing water intake, decreasing eating out and emotional eating strategies.  Claire Weber has agreed to follow-up with our clinic in 4 weeks. She was informed of the importance of frequent follow-up visits to maximize her success with intensive lifestyle modifications for her multiple health conditions.   Objective:   Blood pressure 119/81, pulse 79, temperature 98.2 F (36.8 C), temperature source Oral, height 5\' 3"  (1.6 m), weight 251 lb (113.9 kg), SpO2 98 %. Body mass index is 44.46 kg/m.  General: Cooperative, alert, well developed, in no acute distress. HEENT: Conjunctivae and lids unremarkable. Cardiovascular: Regular rhythm.  Lungs: Normal work of breathing. Neurologic: No focal deficits.   Lab Results  Component Value Date   CREATININE 0.87 12/23/2019   BUN 12 12/23/2019   NA 141 12/23/2019   K 4.2 12/23/2019   CL 107 12/23/2019   CO2 25 12/23/2019   Lab Results  Component Value Date   ALT 12 12/23/2019   AST 16 12/23/2019  ALKPHOS 104 01/09/2019   BILITOT 0.7 12/23/2019   Lab Results  Component Value Date   HGBA1C 5.3 12/23/2019   HGBA1C 5.2 01/09/2019   Lab Results  Component Value Date   INSULIN 3.2 01/09/2019   Lab Results  Component Value Date   TSH 0.82 12/23/2019   Lab Results  Component Value Date   CHOL 145 12/23/2019   HDL 39 (L) 12/23/2019   LDLCALC 91 12/23/2019   TRIG 60  12/23/2019   CHOLHDL 3.7 12/23/2019   Lab Results  Component Value Date   WBC 5.7 12/23/2019   HGB 14.1 12/23/2019   HCT 41.2 12/23/2019   MCV 92.6 12/23/2019   PLT 253 12/23/2019   No results found for: IRON, TIBC, FERRITIN  Attestation Statements:   Reviewed by clinician on day of visit: allergies, medications, problem list, medical history, surgical history, family history, social history, and previous encounter notes.   I, Trixie Dredge, am acting as transcriptionist for Dennard Nip, MD.  I have reviewed the above documentation for accuracy and completeness, and I agree with the above. -  Dennard Nip, MD

## 2020-01-21 ENCOUNTER — Ambulatory Visit: Payer: BC Managed Care – PPO | Admitting: Family Medicine

## 2020-01-24 ENCOUNTER — Ambulatory Visit: Payer: BC Managed Care – PPO | Admitting: Family Medicine

## 2020-01-24 ENCOUNTER — Other Ambulatory Visit: Payer: Self-pay

## 2020-01-24 ENCOUNTER — Encounter: Payer: Self-pay | Admitting: Family Medicine

## 2020-01-24 VITALS — BP 126/72 | HR 84 | Temp 97.8°F | Resp 14 | Ht 63.0 in | Wt 254.0 lb

## 2020-01-24 DIAGNOSIS — F5104 Psychophysiologic insomnia: Secondary | ICD-10-CM

## 2020-01-24 DIAGNOSIS — F331 Major depressive disorder, recurrent, moderate: Secondary | ICD-10-CM | POA: Diagnosis not present

## 2020-01-24 DIAGNOSIS — F411 Generalized anxiety disorder: Secondary | ICD-10-CM

## 2020-01-24 MED ORDER — BUPROPION HCL ER (SR) 150 MG PO TB12: 150.0000 mg | ORAL_TABLET | Freq: Two times a day (BID) | ORAL | 2 refills | Status: DC

## 2020-01-24 NOTE — Patient Instructions (Addendum)
Stop lexapro  Increase wellbutrin to 150mg  twice a day  Referral to new therapist  F/U 6 weeks

## 2020-01-24 NOTE — Assessment & Plan Note (Addendum)
At this time again I have her stop the Lexapro as she is down to 10 mg.  She states that she is actually forgotten a couple doses before never had any problems since the Wellbutrin was in her system.  We will increase her Wellbutrin to 150 mg twice a day.  We will have her take 1 in the morning the second 1 about dinnertime.  Difficult to say if the wooziness is coming from the second dose of the Wellbutrin or she may not be drinking enough water eating out during her lunchtime. Continue the alprazolam as needed.  We will try to find her a new psychologist that she can afford.  She is contemplating a switch in her job.  She has an interview this afternoon.  Taking a timeout she is noted the increased stressors being at her current workplace.  Return to work on Monday.  We will follow-up in 6 weeks for medications

## 2020-01-24 NOTE — Progress Notes (Signed)
Subjective:    Patient ID: Claire Weber, female    DOB: 1986/01/15, 34 y.o.   MRN: [id number]  Patient presents for Follow-up (return to work)  Patient here for interim follow-up on her depression mood disorder insomnia.  She has been out of work for the past few weeks based on my recommendations in order to titrate medication give her time for psychotherapy and overall mental health break.  At her last visit 2 weeks ago I decrease her Lexapro down to 10 mg a day and she was continued on Wellbutrin 100 mg twice a day.  Is also still using alprazolam as needed for sleep. She can tell a difference with the Wellbutrin feels like she does have more energy.  Her sleep is fair she does have to take the alprazolam most nights.  She does state that around 4:00 every afternoon she starts to feel a little woozy she is not sure what that was from.  She typically takes her first Wellbutrin around 730 in the second 1 anywhere between noon and 4pm   She did have a stressful week awaiting her approval for short term disability.  She had appt with psychologist and told she would have to pay $200 up front, so she never had appt with anyone.  She still wants to pursue psychotherapy.  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  Review Of Systems:  GEN- denies fatigue, fever, weight loss,weakness, recent illness HEENT- denies eye drainage, change in vision, nasal discharge, CVS- denies chest pain, palpitations RESP- denies SOB, cough, wheeze ABD- denies N/V, change in stools, abd pain GU- denies dysuria, hematuria, dribbling, incontinence MSK- denies joint pain, muscle aches, injury Neuro- denies headache, dizziness, syncope, seizure activity       Objective:    BP 126/72   Pulse 84   Temp 97.8 F (36.6 C) (Temporal)   Resp 14   Ht 5\' 3"  (1.6 m)   Wt 254 lb (115.2 kg)   SpO2 98%   BMI 44.99 kg/m  GEN- NAD, alert and oriented x3 Psych-normal  affect and mood.  Little anxious good eye contact well-groomed PHQ-9 score today 19,  2 weeks ago score was 21       Assessment & Plan:      Problem List Items Addressed This Visit      Unprioritized   Chronic insomnia   GAD (generalized anxiety disorder)   Relevant Medications   buPROPion (WELLBUTRIN SR) 150 MG 12 hr tablet   MDD (major depressive disorder) - Primary    At this time again I have her stop the Lexapro as she is down to 10 mg.  She states that she is actually forgotten a couple doses before never had any problems since the Wellbutrin was in her system.  We will increase her Wellbutrin to 150 mg twice a day.  We will have her take 1 in the morning the second 1 about dinnertime.  Difficult to say if the wooziness is coming from the second dose of the Wellbutrin or she may not be drinking enough water eating out during her lunchtime. Continue the alprazolam as needed.  We will try to find her a new psychologist that she can afford.  She is contemplating a switch in her job.  She has an interview this afternoon.  Taking a timeout she is noted the increased stressors being at her current workplace.  Return to work on  Monday.  We will follow-up in 6 weeks for medications      Relevant Medications   buPROPion (WELLBUTRIN SR) 150 MG 12 hr tablet      Note: This dictation was prepared with Dragon dictation along with smaller phrase technology. Any transcriptional errors that result from this process are unintentional.

## 2020-01-25 ENCOUNTER — Encounter: Payer: Self-pay | Admitting: Family Medicine

## 2020-01-27 MED ORDER — BUPROPION HCL ER (SR) 150 MG PO TB12: 150.0000 mg | ORAL_TABLET | Freq: Two times a day (BID) | ORAL | 2 refills | Status: DC

## 2020-01-28 ENCOUNTER — Ambulatory Visit (INDEPENDENT_AMBULATORY_CARE_PROVIDER_SITE_OTHER): Payer: BC Managed Care – PPO | Admitting: Family Medicine

## 2020-01-28 ENCOUNTER — Encounter: Payer: Self-pay | Admitting: *Deleted

## 2020-02-10 ENCOUNTER — Encounter: Payer: Self-pay | Admitting: *Deleted

## 2020-02-11 ENCOUNTER — Ambulatory Visit (INDEPENDENT_AMBULATORY_CARE_PROVIDER_SITE_OTHER): Payer: BC Managed Care – PPO | Admitting: Family Medicine

## 2020-02-13 ENCOUNTER — Ambulatory Visit (INDEPENDENT_AMBULATORY_CARE_PROVIDER_SITE_OTHER): Payer: BC Managed Care – PPO | Admitting: Family Medicine

## 2020-02-20 DIAGNOSIS — Z23 Encounter for immunization: Secondary | ICD-10-CM | POA: Diagnosis not present

## 2020-03-11 ENCOUNTER — Encounter: Payer: Self-pay | Admitting: Family Medicine

## 2020-03-11 ENCOUNTER — Ambulatory Visit (INDEPENDENT_AMBULATORY_CARE_PROVIDER_SITE_OTHER): Payer: BC Managed Care – PPO | Admitting: Family Medicine

## 2020-03-11 ENCOUNTER — Other Ambulatory Visit: Payer: Self-pay

## 2020-03-11 VITALS — BP 128/72 | HR 90 | Temp 98.7°F | Resp 14 | Ht 63.0 in | Wt 258.0 lb

## 2020-03-11 DIAGNOSIS — F5104 Psychophysiologic insomnia: Secondary | ICD-10-CM

## 2020-03-11 DIAGNOSIS — F331 Major depressive disorder, recurrent, moderate: Secondary | ICD-10-CM

## 2020-03-11 DIAGNOSIS — F411 Generalized anxiety disorder: Secondary | ICD-10-CM

## 2020-03-11 MED ORDER — ALPRAZOLAM 1 MG PO TABS: 1.0000 mg | ORAL_TABLET | Freq: Two times a day (BID) | ORAL | 2 refills | Status: DC | PRN

## 2020-03-11 NOTE — Assessment & Plan Note (Signed)
Significant long-term history of major depression and anxiety.  She has been cycling through these symptoms for many years along with the difficulties with her job.  She is often worried about what her coworkers think of her and that they may be talking behind her back.  She also states that they make comments with regards to her weight and what she is eating.  Discussed with her that we really need to break the cycle and get set up with psychiatry and psychotherapy in order to help her mental health.  I think that we will turn help with the weight concern she has that she is emotionally eating.  I did not recommend discontinuing her medications cold [place] if anything we need to get her on the right doses to help with her symptoms.  She is agreeable to staying on the Wellbutrin she will take 150 mg in the morning and try 75 mg in the afternoon.  I increase her alprazolam to 1 mg twice a day while we make this change to her Wellbutrin.  We discussed FMLA so that she can get her appointments without feeling regretful towards her coworkers.  She is going to look into the intermittent FMLA. He is also going to call [facility] after this appointment and send me a message back when her appointment is scheduled.  She is contracted for safety no active suicidal ideations.

## 2020-03-11 NOTE — Patient Instructions (Addendum)
Talk HR- FMLA  Needed for appoinments  Xanax changed to twice a day  Try taking the 1/2 tablet of wellbutrin  84m in the afternoon  Send me a message when your appointment is with therapist F/U 4 weeks ( 30 minute)

## 2020-03-11 NOTE — Progress Notes (Signed)
Subjective:    Patient ID: Claire Weber, female    DOB: [birth year], 34 y.o.   MRN: [id number]  Patient presents for Follow-up (is fasting)  Patient here to follow-up depression and medications.  She is being treated for major depression as well as generalized anxiety and chronic insomnia.  She is currently back at work and She is currently on Wellbutrin 150 mg once a day and as needed alprazolam. SHe was discontinued off of Lexapro at her last visit.  We discussed psychotherapy as she has significant stressors in the workplace as well as at home.  Difficulty affording the last therapist that she was referred to. She was referred to [facility] who is willing to do telehealth at her last visit but she has not set this up yet.  She broke out into significant tears during the visit.  States that she feels very depressed and sad all the time.  She does not seem to be able to get it together.  She is very upset by the increased weight gain.  She has been stress eating even more the past month.  She had an interview with a new job and was considering excepting it but then her current job gave her to dollar raise and due to her financial situation she decided to stay at that job.  She then regretted the decision and went back to the other provider but it was not a very good encounter with him and she felt even more depressed like she was a bad person.  She states that even though she has a to tolerate that her current job which she is now committed to things are even worse with HR matters and staffing and she feels like she has no way out.  With regards to the Wellbutrin she states that she does feel better on the Wellbutrin compared to the Lexapro but she did not like taking the evening dose as it made her feel funny and then she felt irritable but now still feels the same way even without the evening dose.  When she does take the Xanax which she typically takes 1 mg she feels much better  and more calm and is able to complete her tasks.  Then she went back and said that she is thinking about is quitting all of her medications cold [place]   Review Of Systems:  GEN- denies fatigue, fever, weight loss,weakness, recent illness HEENT- denies eye drainage, change in vision, nasal discharge, CVS- denies chest pain, palpitations RESP- denies SOB, cough, wheeze ABD- denies N/V, change in stools, abd pain GU- denies dysuria, hematuria, dribbling, incontinence MSK- denies joint pain, muscle aches, injury Neuro- denies headache, dizziness, syncope, seizure activity       Objective:    BP 128/72   Pulse 90   Temp 98.7 F (37.1 C) (Temporal)   Resp 14   Ht 5\' 3"  (1.6 m)   Wt 258 lb (117 kg)   SpO2 99%   BMI 45.70 kg/m  GEN- NAD, alert and oriented x3 Psych tearful depressed affect not anxious normal speech no suicidal ideations fair eye contact PHQ-9 score 21      Assessment & Plan:    Approximately 35 minutes spent with patient greater than 50% on medication management and counseling Problem List Items Addressed This Visit      Unprioritized   Chronic insomnia   GAD (generalized anxiety disorder)   Relevant Medications   ALPRAZolam (XANAX) 1 MG tablet  MDD (major depressive disorder) - Primary    Significant long-term history of major depression and anxiety.  She has been cycling through these symptoms for many years along with the difficulties with her job.  She is often worried about what her coworkers think of her and that they may be talking behind her back.  She also states that they make comments with regards to her weight and what she is eating.  Discussed with her that we really need to break the cycle and get set up with psychiatry and psychotherapy in order to help her mental health.  I think that we will turn help with the weight concern she has that she is emotionally eating.  I did not recommend discontinuing her medications cold [place] if anything we  need to get her on the right doses to help with her symptoms.  She is agreeable to staying on the Wellbutrin she will take 150 mg in the morning and try 75 mg in the afternoon.  I increase her alprazolam to 1 mg twice a day while we make this change to her Wellbutrin.  We discussed FMLA so that she can get her appointments without feeling regretful towards her coworkers.  She is going to look into the intermittent FMLA. He is also going to call [facility] after this appointment and send me a message back when her appointment is scheduled.  She is contracted for safety no active suicidal ideations.      Relevant Medications   ALPRAZolam (XANAX) 1 MG tablet      Note: This dictation was prepared with Dragon dictation along with smaller phrase technology. Any transcriptional errors that result from this process are unintentional.

## 2020-03-20 ENCOUNTER — Encounter: Payer: Self-pay | Admitting: Family Medicine

## 2020-03-20 DIAGNOSIS — Z23 Encounter for immunization: Secondary | ICD-10-CM | POA: Diagnosis not present

## 2020-04-02 ENCOUNTER — Encounter: Payer: Self-pay | Admitting: Family Medicine

## 2020-04-13 ENCOUNTER — Ambulatory Visit: Payer: BC Managed Care – PPO | Admitting: Family Medicine

## 2020-06-24 ENCOUNTER — Encounter: Payer: Self-pay | Admitting: Family Medicine

## 2020-06-24 ENCOUNTER — Ambulatory Visit: Payer: BC Managed Care – PPO | Admitting: Family Medicine

## 2020-06-24 ENCOUNTER — Other Ambulatory Visit: Payer: Self-pay

## 2020-06-24 VITALS — BP 130/74 | HR 80 | Temp 98.1°F | Resp 16 | Ht 63.0 in | Wt 255.0 lb

## 2020-06-24 DIAGNOSIS — G44209 Tension-type headache, unspecified, not intractable: Secondary | ICD-10-CM

## 2020-06-24 DIAGNOSIS — F331 Major depressive disorder, recurrent, moderate: Secondary | ICD-10-CM

## 2020-06-24 DIAGNOSIS — R55 Syncope and collapse: Secondary | ICD-10-CM | POA: Diagnosis not present

## 2020-06-24 DIAGNOSIS — E559 Vitamin D deficiency, unspecified: Secondary | ICD-10-CM | POA: Diagnosis not present

## 2020-06-24 DIAGNOSIS — R5383 Other fatigue: Secondary | ICD-10-CM

## 2020-06-24 DIAGNOSIS — Z6841 Body Mass Index (BMI) 40.0 and over, adult: Secondary | ICD-10-CM

## 2020-06-24 DIAGNOSIS — F411 Generalized anxiety disorder: Secondary | ICD-10-CM

## 2020-06-24 MED ORDER — TOPIRAMATE 25 MG PO TABS: ORAL_TABLET | ORAL | 1 refills | Status: DC

## 2020-06-24 MED ORDER — ALPRAZOLAM 1 MG PO TABS: 1.0000 mg | ORAL_TABLET | Freq: Two times a day (BID) | ORAL | 2 refills | Status: DC | PRN

## 2020-06-24 NOTE — Progress Notes (Signed)
Subjective:    Patient ID: Claire Weber, female    DOB: 1986/05/31, 34 y.o.   MRN: [id number]  Patient presents for Pain in Head (x weeks- intermittent pain in forehead that is sharp across foehead) and Fatigue (states that she has felt very fatigued and lightheaded lately)   Continues to have significant stressors at work  She is fatigued all the time, feels like she just cant take a deep breath she is so overwhelmed. She also feels faint at times   This morning her daughter couldn't go to Daycare due to low grade fever, that was stressful  Mother has recent surgery on her back, and she has been helping her as well   She has had intermittant episodes of tension pain that shoots across her the front of her head, feels like a rubber band or vice, last a couple minutes then stops No vision changes  she has had about 4 episodes over the past year, but she was worried as the last one occurred at work he felt so weak afterward she felt like she was going to pass out.  She has a copay and cost has been an issue with regards to scheduling with psychiatry. She tapered off wellbutrin does not want to be on meds Uses xanax very rarely    Review Of Systems:  GEN- +fatigue, fever, weight loss,weakness, recent illness HEENT- denies eye drainage, change in vision, nasal discharge, CVS- denies chest pain, palpitations RESP- denies SOB, cough, wheeze ABD- denies N/V, change in stools, abd pain GU- denies dysuria, hematuria, dribbling, incontinence MSK- denies joint pain, muscle aches, injury Neuro- denies headache, dizziness, syncope, seizure activity       Objective:    BP 130/74   Pulse 80   Temp 98.1 F (36.7 C) (Temporal)   Resp 16   Ht 5\' 3"  (1.6 m)   Wt 255 lb (115.7 kg)   SpO2 98%   BMI 45.17 kg/m  GEN- NAD, alert and oriented x3 HEENT- PERRL, EOMI, non injected sclera, pink conjunctiva, MMM, oropharynx clear Neck- Supple, no thyromegaly CVS- RRR, no  murmur RESP-CTAB ABD-NABS,soft,NT,ND NEURO-CNII-XII in tact, no focal deficits Psych- depressed affect, tearful, not anxious, no SI PHQ 9 score 23 , GAD 7 score 18  EXT- No edema Pulses- Radial, DP- 2+   EKG NSR, mild flat non specific t waves, lateral leads      Assessment & Plan:      Problem List Items Addressed This Visit      Unprioritized   GAD (generalized anxiety disorder)   Relevant Medications   ALPRAZolam (XANAX) 1 MG tablet   MDD (major depressive disorder)    I think her major depression been uncontrolled as the source of her stressors.  Discussed this multiple times before.  This is very challenging as she is a single parent and having issues with her father child his family her job she is getting hit every angle.  I think she would benefit from intensive psychotherapy and medications but she does not want to pursue meds at this time.  I gave her some other options for psychotherapy today.  She will continue the alprazolam which she only uses when she feels like she is at her living point.  With regards to the headaches do sound more like tension headaches which she has had in the past.  Neurologically everything is intact.  I do not think she requires imaging today.  Her blood pressure is also normal.  Or  the other fatigue weak spells.  We will check her labs today.  We discussed her nutrition she is very depressed about her weight but she also notes that she stress eats because of her above problems.  She often is snacking a lot at nighttime as well.  Recommend we try Topamax 25 mg at bedtime to help with the stress eating hunger and the headaches.  She is willing to try this.  We will start at 25 mg at bedtime.      Relevant Medications   ALPRAZolam (XANAX) 1 MG tablet   Obesity   Relevant Orders   CBC with Differential/Platelet   Comprehensive metabolic panel   Vitamin D deficiency   Relevant Orders   Vitamin D, 25-hydroxy    Other Visit Diagnoses    Fatigue,  unspecified type    -  Primary   Relevant Orders   TSH   CBC with Differential/Platelet   Comprehensive metabolic panel   Vitamin D, 25-hydroxy   Tension headache       Relevant Medications   topiramate (TOPAMAX) 25 MG tablet   Pre-syncope       Relevant Orders   EKG 12-Lead (Completed)      Note: This dictation was prepared with Dragon dictation along with smaller phrase technology. Any transcriptional errors that result from this process are unintentional.

## 2020-06-24 NOTE — Patient Instructions (Addendum)
Try a meditation APP like Calm Increase your water Try to take a daily walk to help relax you Call your insurance for other therapy options  Try [facility]  [facility]  [facility]   Try topamax 25mg  at bedtime  F/U schedule Physical

## 2020-06-24 NOTE — Assessment & Plan Note (Signed)
I think her major depression been uncontrolled as the source of her stressors.  Discussed this multiple times before.  This is very challenging as she is a single parent and having issues with her father child his family her job she is getting hit every angle.  I think she would benefit from intensive psychotherapy and medications but she does not want to pursue meds at this time.  I gave her some other options for psychotherapy today.  She will continue the alprazolam which she only uses when she feels like she is at her living point.  With regards to the headaches do sound more like tension headaches which she has had in the past.  Neurologically everything is intact.  I do not think she requires imaging today.  Her blood pressure is also normal.  Or the other fatigue weak spells.  We will check her labs today.  We discussed her nutrition she is very depressed about her weight but she also notes that she stress eats because of her above problems.  She often is snacking a lot at nighttime as well.  Recommend we try Topamax 25 mg at bedtime to help with the stress eating hunger and the headaches.  She is willing to try this.  We will start at 25 mg at bedtime.

## 2020-06-25 LAB — CBC WITH DIFFERENTIAL/PLATELET
Absolute Monocytes: 454 cells/uL (ref 200–950)
Basophils Absolute: 32 cells/uL (ref 0–200)
Basophils Relative: 0.5 %
Eosinophils Absolute: 102 cells/uL (ref 15–500)
Eosinophils Relative: 1.6 %
HCT: 41.7 % (ref 35.0–45.0)
Hemoglobin: 13.9 g/dL (ref 11.7–15.5)
Lymphs Abs: 2163 cells/uL (ref 850–3900)
MCH: 31.6 pg (ref 27.0–33.0)
MCHC: 33.3 g/dL (ref 32.0–36.0)
MCV: 94.8 fL (ref 80.0–100.0)
MPV: 11.6 fL (ref 7.5–12.5)
Monocytes Relative: 7.1 %
Neutro Abs: 3648 cells/uL (ref 1500–7800)
Neutrophils Relative %: 57 %
Platelets: 256 10*3/uL (ref 140–400)
RBC: 4.4 10*6/uL (ref 3.80–5.10)
RDW: 12.1 % (ref 11.0–15.0)
Total Lymphocyte: 33.8 %
WBC: 6.4 10*3/uL (ref 3.8–10.8)

## 2020-06-25 LAB — COMPREHENSIVE METABOLIC PANEL
AG Ratio: 1.4 (calc) (ref 1.0–2.5)
ALT: 14 U/L (ref 6–29)
AST: 14 U/L (ref 10–30)
Albumin: 3.9 g/dL (ref 3.6–5.1)
Alkaline phosphatase (APISO): 86 U/L (ref 31–125)
BUN: 11 mg/dL (ref 7–25)
CO2: 27 mmol/L (ref 20–32)
Calcium: 9.2 mg/dL (ref 8.6–10.2)
Chloride: 105 mmol/L (ref 98–110)
Creat: 0.86 mg/dL (ref 0.50–1.10)
Globulin: 2.8 g/dL (calc) (ref 1.9–3.7)
Glucose, Bld: 77 mg/dL (ref 65–99)
Potassium: 4.3 mmol/L (ref 3.5–5.3)
Sodium: 139 mmol/L (ref 135–146)
Total Bilirubin: 0.5 mg/dL (ref 0.2–1.2)
Total Protein: 6.7 g/dL (ref 6.1–8.1)

## 2020-06-25 LAB — VITAMIN D 25 HYDROXY (VIT D DEFICIENCY, FRACTURES): Vit D, 25-Hydroxy: 18 ng/mL — ABNORMAL LOW (ref 30–100)

## 2020-06-25 LAB — TSH: TSH: 0.79 mIU/L

## 2020-06-30 ENCOUNTER — Other Ambulatory Visit: Payer: Self-pay

## 2020-06-30 MED ORDER — ERGOCALCIFEROL 1.25 MG (50000 UT) PO CAPS: 50000.0000 [IU] | ORAL_CAPSULE | ORAL | 0 refills | Status: DC

## 2020-08-21 DIAGNOSIS — I781 Nevus, non-neoplastic: Secondary | ICD-10-CM | POA: Diagnosis not present

## 2020-08-21 DIAGNOSIS — L668 Other cicatricial alopecia: Secondary | ICD-10-CM | POA: Diagnosis not present

## 2020-08-21 DIAGNOSIS — L858 Other specified epidermal thickening: Secondary | ICD-10-CM | POA: Diagnosis not present

## 2020-09-09 ENCOUNTER — Other Ambulatory Visit: Payer: Medicaid Other

## 2020-09-09 ENCOUNTER — Other Ambulatory Visit: Payer: Self-pay | Admitting: *Deleted

## 2020-09-09 DIAGNOSIS — Z20822 Contact with and (suspected) exposure to covid-19: Secondary | ICD-10-CM

## 2020-09-11 LAB — NOVEL CORONAVIRUS, NAA: SARS-CoV-2, NAA: NOT DETECTED

## 2020-09-11 LAB — SARS-COV-2, NAA 2 DAY TAT

## 2020-09-11 LAB — SPECIMEN STATUS REPORT

## 2020-09-30 ENCOUNTER — Encounter: Payer: Self-pay | Admitting: Family Medicine

## 2020-10-28 DIAGNOSIS — F4323 Adjustment disorder with mixed anxiety and depressed mood: Secondary | ICD-10-CM | POA: Diagnosis not present

## 2020-10-30 DIAGNOSIS — Z01419 Encounter for gynecological examination (general) (routine) without abnormal findings: Secondary | ICD-10-CM | POA: Diagnosis not present

## 2020-10-30 DIAGNOSIS — Z124 Encounter for screening for malignant neoplasm of cervix: Secondary | ICD-10-CM | POA: Diagnosis not present

## 2020-10-30 DIAGNOSIS — Z30431 Encounter for routine checking of intrauterine contraceptive device: Secondary | ICD-10-CM | POA: Diagnosis not present

## 2020-10-30 DIAGNOSIS — I10 Essential (primary) hypertension: Secondary | ICD-10-CM | POA: Diagnosis not present

## 2020-12-03 DIAGNOSIS — F4323 Adjustment disorder with mixed anxiety and depressed mood: Secondary | ICD-10-CM | POA: Diagnosis not present

## 2020-12-17 DIAGNOSIS — F4323 Adjustment disorder with mixed anxiety and depressed mood: Secondary | ICD-10-CM | POA: Diagnosis not present

## 2021-01-09 ENCOUNTER — Other Ambulatory Visit: Payer: BC Managed Care – PPO

## 2021-01-09 DIAGNOSIS — Z20822 Contact with and (suspected) exposure to covid-19: Secondary | ICD-10-CM

## 2021-01-12 ENCOUNTER — Ambulatory Visit: Payer: Self-pay | Admitting: *Deleted

## 2021-01-12 LAB — NOVEL CORONAVIRUS, NAA: SARS-CoV-2, NAA: NOT DETECTED

## 2021-01-12 NOTE — Telephone Encounter (Signed)
  Patient calling for information concerning covid questions. Patient requesting to know when can she return to work and how long she should stay out of work due to covid symptoms. Reviewed with patient information of when she was tested due to covid cases at her work. Paitent tested Saturday and she is now having symptoms of head cold symptoms, body aches. covid results still pending at this time. Encouraged patient to check My Chart for results and would be notified of confirmation via phone or via My Chart message is results are positive. Reviewed patient can get booster 5 months after receiving 2nd vaccine and as long as she was not having any symptoms of fever or respiratory issues before receiveing booster. Care advise given. Patient verbalized understanding of care advise and to call back if needed or call PCP.   Reason for Disposition . Health Information question, no triage required and triager able to answer question  Answer Assessment - Initial Assessment Questions 1. REASON FOR CALL or QUESTION: "What is your reason for calling today?" or "How can I best help you?" or "What question do you have that I can help answer?"     Patient asking when she can return to work after having symptoms of covid, and when she can get her booster?  Protocols used: INFORMATION ONLY CALL - NO TRIAGE-A-AH

## 2021-01-14 DIAGNOSIS — F4323 Adjustment disorder with mixed anxiety and depressed mood: Secondary | ICD-10-CM | POA: Diagnosis not present

## 2021-01-19 ENCOUNTER — Other Ambulatory Visit: Payer: BC Managed Care – PPO

## 2021-01-19 DIAGNOSIS — Z20822 Contact with and (suspected) exposure to covid-19: Secondary | ICD-10-CM

## 2021-01-21 ENCOUNTER — Ambulatory Visit: Payer: Self-pay | Admitting: *Deleted

## 2021-01-21 ENCOUNTER — Ambulatory Visit: Payer: Self-pay

## 2021-01-21 LAB — NOVEL CORONAVIRUS, NAA: SARS-CoV-2, NAA: DETECTED — AB

## 2021-01-21 LAB — SARS-COV-2, NAA 2 DAY TAT

## 2021-01-21 NOTE — Telephone Encounter (Signed)
Called patient to review covid positive questions. Patient has already been advised by NT earlier. Patient reports she has no further questions.   Reason for Disposition . Caller has already spoken with another triager and has no further questions.  Answer Assessment - Initial Assessment Questions 1. REASON FOR CALL or QUESTION: "What is your reason for calling today?" or "How can I best help you?" or "What question do you have that I can help answer?"     Calling for covid questions. Patient already had call from another triage RN.  Answer Assessment - Initial Assessment Questions N/A Patient has already received information from another NT . No further questions noted.  Protocols used: NO CONTACT OR DUPLICATE CONTACT CALL-A-AH, INFORMATION ONLY CALL - NO TRIAGE-A-AH

## 2021-01-21 NOTE — Telephone Encounter (Signed)
Claire Weber called in due to positive covid test.  I answered her many questions.   I explained in detail several times the quarantine protocol per the CDC.  She did verbalize understanding of the instructions as she repeated them back to me.  Patient notified of positive COVID-19 test results. Pt verbalized understanding. Pt reports symptoms of nasal congestion and felt bad.  Criteria for self-isolation if you test positive for COVID-19, regardless of vaccination status:  -If you have mild symptoms that are resolving or have resolved, isolate at home for 5 days since symptoms started AND continue to wear a well-fitted mask when around others in the home and in public for 5 additional days after isolation is completed -If you have a fever and/or moderate to severe symptoms, isolate for at least 10 days since the symptoms started AND until you are fever free for at least 24 hours without the use of fever-reducing medications -If you tested positive and did not have symptoms, isolate for at least 5 days after your positive test  Use over-the-counter medications for symptoms.If you develop respiratory issues/distress, seek medical care in the Emergency Department.  If you must leave home or if you have to be around others please wear a mask. Please limit contact with immediate family members in the home, practice social distancing, frequent handwashing and clean hard surfaces touched frequently with household cleaning products. Members of your household will also need to quarantine and test.You may also be contacted by the health department for follow up.     Reason for Disposition . [1] [id number]-[id number] diagnosed by positive lab test (e.g., PCR, rapid self-test kit) AND [2] mild symptoms (e.g., cough, fever, others) AND [0] no complications or SOB  Answer Assessment - Initial Assessment Questions 1. COVID-19 DIAGNOSIS: "Who made your COVID-19 diagnosis?" "Was it confirmed by a positive lab test?" If not diagnosed  by a HCP, ask "Are there lots of cases (community spread) where you live?" Note: See public health department website, if unsure.     I have a positive covid test.   What do I do? 2. COVID-19 EXPOSURE: "Was there any known exposure to COVID before the symptoms began?" CDC Definition of close contact: within 6 feet (2 meters) for a total of 15 minutes or more over a 24-hour period.      I had nasal congestion so I went on to work.    I tested Tuesday and it was positive but I had a cold before that. 3. ONSET: "When did the COVID-19 symptoms start?"      About 2 weeks ago 4. WORST SYMPTOM: "What is your worst symptom?" (e.g., cough, fever, shortness of breath, muscle aches)     Nasal congestion 5. COUGH: "Do you have a cough?" If Yes, ask: "How bad is the cough?"       *No Answer* 6. FEVER: "Do you have a fever?" If Yes, ask: "What is your temperature, how was it measured, and when did it start?"     *No Answer* 7. RESPIRATORY STATUS: "Describe your breathing?" (e.g., shortness of breath, wheezing, unable to speak)      *No Answer* 8. BETTER-SAME-WORSE: "Are you getting better, staying the same or getting worse compared to yesterday?"  If getting worse, ask, "In what way?"     *No Answer* 9. HIGH RISK DISEASE: "Do you have any chronic medical problems?" (e.g., asthma, heart or lung disease, weak immune system, obesity, etc.)     *No Answer* 10. VACCINE: "Have you  gotten the COVID-19 vaccine?" If Yes ask: "Which one, how many shots, when did you get it?"       *No Answer* 11. PREGNANCY: "Is there any chance you are pregnant?" "When was your last menstrual period?"       *No Answer* 12. OTHER SYMPTOMS: "Do you have any other symptoms?"  (e.g., chills, fatigue, headache, loss of smell or taste, muscle pain, sore throat; new loss of smell or taste especially support the diagnosis of COVID-19)       *No Answer*  Protocols used: CORONAVIRUS (COVID-19) DIAGNOSED OR SUSPECTED-A-AH

## 2021-01-21 NOTE — Telephone Encounter (Signed)
Attempted to call patient- cell number not a working number- mother answered home numb r- left message for patient to call back.

## 2021-01-21 NOTE — Telephone Encounter (Signed)
Patient called stating that she tested positive for COVID-19 and her daughter was negative. She needs advice as to how to handle this within her home.  She was told to isolate as much as possible from others. Wear mask when in the same room. And practice good hand hygiene and wipe surfaces that are wipe able with regular household cleaning solutions regularly.  Others in her home who are exposed shoud quarantine and wear a mask in public Monitor for symptoms and test if symptoms develop or wait at least 5 days from exposure to test.  She verbalized understanding of all information   She was encouraged to call back for any additional concerns.  Answer Assessment - Initial Assessment Questions 1. REASON FOR CALL or QUESTION: "What is your reason for calling today?" or "How can I best help you?" or "What question do you have that I can help answer?"     Questions about COVID-19 and she is positive and daughter tested negative  Protocols used: INFORMATION ONLY CALL - NO TRIAGE-A-AH

## 2021-01-21 NOTE — Telephone Encounter (Signed)
Attempted to call patient- mo

## 2021-01-21 NOTE — Telephone Encounter (Signed)
Patient called with covid questions to review, patient not available , left message with patient's mother to call back at #[phone].

## 2021-01-28 DIAGNOSIS — F4323 Adjustment disorder with mixed anxiety and depressed mood: Secondary | ICD-10-CM | POA: Diagnosis not present

## 2021-02-08 ENCOUNTER — Encounter: Payer: Self-pay | Admitting: Family Medicine

## 2021-02-08 ENCOUNTER — Other Ambulatory Visit: Payer: Self-pay | Admitting: Family Medicine

## 2021-02-08 NOTE — Telephone Encounter (Signed)
Refill Xanax

## 2021-02-09 MED ORDER — ALPRAZOLAM 1 MG PO TABS: 1.0000 mg | ORAL_TABLET | Freq: Two times a day (BID) | ORAL | 2 refills | Status: DC | PRN

## 2021-02-10 ENCOUNTER — Encounter: Payer: Self-pay | Admitting: Family Medicine

## 2021-02-10 ENCOUNTER — Other Ambulatory Visit: Payer: Self-pay

## 2021-02-10 ENCOUNTER — Ambulatory Visit: Payer: BC Managed Care – PPO | Admitting: Family Medicine

## 2021-02-10 VITALS — BP 124/62 | HR 80 | Temp 98.1°F | Resp 16 | Ht 63.0 in | Wt 256.0 lb

## 2021-02-10 DIAGNOSIS — F5104 Psychophysiologic insomnia: Secondary | ICD-10-CM | POA: Diagnosis not present

## 2021-02-10 DIAGNOSIS — Z6841 Body Mass Index (BMI) 40.0 and over, adult: Secondary | ICD-10-CM

## 2021-02-10 DIAGNOSIS — F331 Major depressive disorder, recurrent, moderate: Secondary | ICD-10-CM | POA: Diagnosis not present

## 2021-02-10 DIAGNOSIS — E559 Vitamin D deficiency, unspecified: Secondary | ICD-10-CM | POA: Diagnosis not present

## 2021-02-10 DIAGNOSIS — F411 Generalized anxiety disorder: Secondary | ICD-10-CM | POA: Diagnosis not present

## 2021-02-10 MED ORDER — BUPROPION HCL ER (XL) 150 MG PO TB24: 150.0000 mg | ORAL_TABLET | Freq: Every day | ORAL | 2 refills | Status: DC

## 2021-02-10 NOTE — Assessment & Plan Note (Addendum)
Again reiterated the importance of following up with her providers and trying to improve her mental health. She is relying on daily benzo without good symptoms control She is willing to go back on wellbutrin XL after further discussion of meds She will start 150mg  XR, will continue xanax at bedtime for now as she bridges, she does want to come off benzo and understands the dependence of this medication Continue with psychotherapy She also continues to have concerns over her weight but not in the right mental space to take on the challenge of meal prepping/ habit changes yet. She has not followed up with her previous weight loss provider I think if she can get her mental health on track she can then focus on weight loss without being overwhelmed F/u new PCP She will contact me via mychart in 2 weeks to see how wellbutrin is doing

## 2021-02-10 NOTE — Progress Notes (Signed)
Subjective:    Patient ID: Claire Weber, female    DOB: 1986/04/27, 35 y.o.   MRN: [id number]  Patient presents for Medication Review/ Refill (anxiety/ insomnia)  Pt here to f/u medication  last visit was in June 2021   GAD/MDD-   Pt with long standing history of anxiety and depression. States she is the same as before nothing has really changed  She is getting therapy twice a month, at [facility] since November and feels benefit  She is looking at a career change but still feels uneasy about the decision She does use xanax 1/2 tab before bed at night If she doesn't take it, she wont sleep and she feels more depressed the next day , she still feels overwhelmed and worries about everything, feels sad and low most of the day as well  She is has been effexor , zoloft, lexapro, wellbutrin No SE with wellbutrin but she is worried about effect on her libido as she already has low libido  Needs Vitamin D rechecked    Review Of Systems:  GEN- denies fatigue, fever, weight loss,weakness, recent illness HEENT- denies eye drainage, change in vision, nasal discharge, CVS- denies chest pain, palpitations RESP- denies SOB, cough, wheeze ABD- denies N/V, change in stools, abd pain GU- denies dysuria, hematuria, dribbling, incontinence MSK- denies joint pain, muscle aches, injury Neuro- denies headache, dizziness, syncope, seizure activity       Objective:    BP 124/62   Pulse 80   Temp 98.1 F (36.7 C) (Temporal)   Resp 16   Ht 5\' 3"  (1.6 m)   Wt 256 lb (116.1 kg)   SpO2 98%   BMI 45.35 kg/m  GEN- NAD, alert and oriented x3 HEENT- PERRL, EOMI, non injected sclera, pink conjunctiva, Neck- Supple, no thyromegaly CVS- RRR, no murmur RESP-CTAB Psych depressed affect not anxious, no SI, PHQ 9 score 23 GAD 7 score  18 EXT- No edema Pulses- Radial  2+        Assessment & Plan:   approx 25 minutes spent with  Pt > 50% on counseling/med discussion   Problem List Items  Addressed This Visit      Unprioritized   Chronic insomnia   GAD (generalized anxiety disorder)   Relevant Medications   buPROPion (WELLBUTRIN XL) 150 MG 24 hr tablet   MDD (major depressive disorder) - Primary    Again reiterated the importance of following up with her providers and trying to improve her mental health. She is relying on daily benzo without good symptoms control She is willing to go back on wellbutrin XL after further discussion of meds She will start 150mg  XR, will continue xanax at bedtime for now as she bridges, she does want to come off benzo and understands the dependence of this medication Continue with psychotherapy She also continues to have concerns over her weight but not in the right mental space to take on the challenge of meal prepping/ habit changes yet. She has not followed up with her previous weight loss provider I think if she can get her mental health on track she can then focus on weight loss without being overwhelmed F/u new PCP She will contact me via mychart in 2 weeks to see how wellbutrin is doing       Relevant Medications   buPROPion (WELLBUTRIN XL) 150 MG 24 hr tablet   Obesity   Relevant Orders   Comprehensive metabolic panel   Vitamin D deficiency  Relevant Orders   Vitamin D, 25-hydroxy      Note: This dictation was prepared with Dragon dictation along with smaller phrase technology. Any transcriptional errors that result from this process are unintentional.

## 2021-02-10 NOTE — Patient Instructions (Addendum)
Start wellbutrin once a day  In the morning  Xanax at bedtime  Send me a mychart message in 2 weeks with how Wellbutrin is doing  Schedule a physical with your new primary

## 2021-02-11 ENCOUNTER — Encounter: Payer: Self-pay | Admitting: Family Medicine

## 2021-02-11 LAB — COMPREHENSIVE METABOLIC PANEL
AG Ratio: 1.4 (calc) (ref 1.0–2.5)
ALT: 10 U/L (ref 6–29)
AST: 14 U/L (ref 10–30)
Albumin: 3.8 g/dL (ref 3.6–5.1)
Alkaline phosphatase (APISO): 87 U/L (ref 31–125)
BUN: 11 mg/dL (ref 7–25)
CO2: 25 mmol/L (ref 20–32)
Calcium: 9.3 mg/dL (ref 8.6–10.2)
Chloride: 106 mmol/L (ref 98–110)
Creat: 0.89 mg/dL (ref 0.50–1.10)
Globulin: 2.8 g/dL (calc) (ref 1.9–3.7)
Glucose, Bld: 87 mg/dL (ref 65–99)
Potassium: 4.2 mmol/L (ref 3.5–5.3)
Sodium: 140 mmol/L (ref 135–146)
Total Bilirubin: 0.4 mg/dL (ref 0.2–1.2)
Total Protein: 6.6 g/dL (ref 6.1–8.1)

## 2021-02-11 LAB — VITAMIN D 25 HYDROXY (VIT D DEFICIENCY, FRACTURES): Vit D, 25-Hydroxy: 24 ng/mL — ABNORMAL LOW (ref 30–100)

## 2021-02-15 ENCOUNTER — Other Ambulatory Visit: Payer: Self-pay | Admitting: *Deleted

## 2021-02-15 MED ORDER — VITAMIN D (ERGOCALCIFEROL) 1.25 MG (50000 UNIT) PO CAPS: 50000.0000 [IU] | ORAL_CAPSULE | ORAL | 0 refills | Status: AC

## 2021-02-18 DIAGNOSIS — F4323 Adjustment disorder with mixed anxiety and depressed mood: Secondary | ICD-10-CM | POA: Diagnosis not present

## 2021-03-03 ENCOUNTER — Encounter: Payer: Self-pay | Admitting: Family Medicine

## 2021-03-12 DIAGNOSIS — F4323 Adjustment disorder with mixed anxiety and depressed mood: Secondary | ICD-10-CM | POA: Diagnosis not present

## 2021-03-26 DIAGNOSIS — F4323 Adjustment disorder with mixed anxiety and depressed mood: Secondary | ICD-10-CM | POA: Diagnosis not present

## 2021-04-07 DIAGNOSIS — F4323 Adjustment disorder with mixed anxiety and depressed mood: Secondary | ICD-10-CM | POA: Diagnosis not present

## 2021-04-16 DIAGNOSIS — F4323 Adjustment disorder with mixed anxiety and depressed mood: Secondary | ICD-10-CM | POA: Diagnosis not present

## 2021-04-22 DIAGNOSIS — F4323 Adjustment disorder with mixed anxiety and depressed mood: Secondary | ICD-10-CM | POA: Diagnosis not present

## 2021-04-30 DIAGNOSIS — F4323 Adjustment disorder with mixed anxiety and depressed mood: Secondary | ICD-10-CM | POA: Diagnosis not present

## 2021-05-14 DIAGNOSIS — F4323 Adjustment disorder with mixed anxiety and depressed mood: Secondary | ICD-10-CM | POA: Diagnosis not present

## 2021-05-18 ENCOUNTER — Telehealth: Payer: Self-pay | Admitting: Family Medicine

## 2021-05-18 MED ORDER — BUPROPION HCL ER (XL) 150 MG PO TB24: 150.0000 mg | ORAL_TABLET | Freq: Every day | ORAL | 0 refills | Status: DC

## 2021-05-18 NOTE — Telephone Encounter (Signed)
Patient called to request courtesy refill of buPROPion (WELLBUTRIN XL) 150 MG 24 hr tablet [[id number]]   Pharmacy confirmed as  [facility] [address], [place] - [address]  [address], [place] [address]  Phone:  [phone] Fax:  [phone]    Patient will be out of medication in 2-3 days.  Please advise patient when Rx called in at [phone]

## 2021-05-20 DIAGNOSIS — F4323 Adjustment disorder with mixed anxiety and depressed mood: Secondary | ICD-10-CM | POA: Diagnosis not present

## 2021-05-28 DIAGNOSIS — F4323 Adjustment disorder with mixed anxiety and depressed mood: Secondary | ICD-10-CM | POA: Diagnosis not present

## 2021-06-04 DIAGNOSIS — F4323 Adjustment disorder with mixed anxiety and depressed mood: Secondary | ICD-10-CM | POA: Diagnosis not present

## 2021-06-11 DIAGNOSIS — F4323 Adjustment disorder with mixed anxiety and depressed mood: Secondary | ICD-10-CM | POA: Diagnosis not present

## 2021-06-14 ENCOUNTER — Telehealth: Payer: Self-pay | Admitting: Family Medicine

## 2021-06-14 MED ORDER — BUPROPION HCL ER (XL) 150 MG PO TB24: 150.0000 mg | ORAL_TABLET | Freq: Every day | ORAL | 0 refills | Status: DC

## 2021-06-14 NOTE — Telephone Encounter (Signed)
Patient called back after speaking with pharmacy. Refill for Xanax not needed. Patient only needs rx for   buPROPion (WELLBUTRIN XL) 150 MG

## 2021-06-14 NOTE — Telephone Encounter (Signed)
Prescription sent to pharmacy for Bupropion.

## 2021-06-14 NOTE — Telephone Encounter (Signed)
Pt called in asking if possible to get a refill of  buPROPion (WELLBUTRIN XL) 150 MG  ALPRAZolam (XANAX) 1 MG tablet  Pt stated that she was a former pt of Dr. Deirdre Peer and since found a new PCP, but appt isn't unitl late July. Pt also stated that she has already received a courtesy refill, but will be out of these meds before this appt. Pt stated that she would just like a refill of these meds to last until 07/28. Please advise.  Cb#: [phone]

## 2021-06-29 DIAGNOSIS — F4323 Adjustment disorder with mixed anxiety and depressed mood: Secondary | ICD-10-CM | POA: Diagnosis not present

## 2021-07-06 ENCOUNTER — Telehealth: Payer: Self-pay | Admitting: Family Medicine

## 2021-07-06 NOTE — Telephone Encounter (Signed)
Pt called in questioning being billed for transfer of records to new pcp? Please call  Cb#: [phone]

## 2021-07-09 DIAGNOSIS — F4323 Adjustment disorder with mixed anxiety and depressed mood: Secondary | ICD-10-CM | POA: Diagnosis not present

## 2021-07-16 DIAGNOSIS — F4323 Adjustment disorder with mixed anxiety and depressed mood: Secondary | ICD-10-CM | POA: Diagnosis not present

## 2021-07-16 DIAGNOSIS — F5101 Primary insomnia: Secondary | ICD-10-CM | POA: Diagnosis not present

## 2021-07-16 DIAGNOSIS — F411 Generalized anxiety disorder: Secondary | ICD-10-CM | POA: Diagnosis not present

## 2021-07-16 DIAGNOSIS — E559 Vitamin D deficiency, unspecified: Secondary | ICD-10-CM | POA: Diagnosis not present

## 2021-07-16 DIAGNOSIS — F331 Major depressive disorder, recurrent, moderate: Secondary | ICD-10-CM | POA: Diagnosis not present

## 2021-07-19 ENCOUNTER — Encounter: Payer: Self-pay | Admitting: Emergency Medicine

## 2021-07-19 ENCOUNTER — Ambulatory Visit
Admission: EM | Admit: 2021-07-19 | Discharge: 2021-07-19 | Disposition: A | Discharge status: Home / Self Care | Payer: BC Managed Care – PPO | Attending: Family Medicine | Admitting: Family Medicine

## 2021-07-19 DIAGNOSIS — Z20822 Contact with and (suspected) exposure to covid-19: Secondary | ICD-10-CM | POA: Diagnosis not present

## 2021-07-19 DIAGNOSIS — B349 Viral infection, unspecified: Secondary | ICD-10-CM

## 2021-07-19 DIAGNOSIS — J069 Acute upper respiratory infection, unspecified: Secondary | ICD-10-CM | POA: Diagnosis not present

## 2021-07-19 MED ORDER — PROMETHAZINE-DM 6.25-15 MG/5ML PO SYRP: 5.0000 mL | ORAL_SOLUTION | Freq: Three times a day (TID) | ORAL | 0 refills | Status: DC | PRN

## 2021-07-19 MED ORDER — PREDNISONE 20 MG PO TABS: 20.0000 mg | ORAL_TABLET | Freq: Every day | ORAL | 0 refills | Status: AC

## 2021-07-19 NOTE — ED Provider Notes (Signed)
[facility]    CSN: [id number] Arrival date & time: 07/19/21  0857      History   Chief Complaint Chief Complaint  Patient presents with   Sore Throat   Cough   Generalized Body Aches    HPI Claire Weber is a 35 y.o. female.   HPI Patient presents with URI symptoms including cough, sore throat, otalgia, nasal congestion, runny nose, and sinus pressure.  Patient has had a direct exposure to COVID x2 within the last 2 to 3 weeks.  Her symptoms began 3 days ago.  She has been without fever.  She has been taking over-the-counter medication without relief.  Denies shortness of breath, wheezing or any chest pain. Past Medical History:  Diagnosis Date   Anxiety    Depression    Medical history non-contributory    Vitamin D deficiency     Patient Active Problem List   Diagnosis Date Noted   GAD (generalized anxiety disorder) 12/25/2019   Vitamin D deficiency 12/23/2019   Hair loss 03/26/2019   Chronic insomnia 01/09/2019   Stress headaches 05/20/2016   Obesity 08/28/2015   MDD (major depressive disorder) 08/28/2015   Lumbago 08/28/2015   IUD (intrauterine device) in place 06/09/2015    Past Surgical History:  Procedure Laterality Date   ABDOMINAL HERNIA REPAIR     at age 41    OB History     Gravida  2   Para  1   Term  1   Preterm      AB  1   Living  1      SAB      IAB  1   Ectopic      Multiple  0   Live Births  1            Home Medications    Prior to Admission medications   Medication Sig Start Date End Date Taking? Authorizing Provider  predniSONE (DELTASONE) 20 MG tablet Take 1 tablet (20 mg total) by mouth daily with breakfast for 5 days. 07/19/21 07/24/21 Yes Bing Neighbors, FNP  promethazine-dextromethorphan (PROMETHAZINE-DM) 6.25-15 MG/5ML syrup Take 5 mLs by mouth 3 (three) times daily as needed for cough. 07/19/21  Yes Bing Neighbors, FNP  Vitamin D, Ergocalciferol, (DRISDOL) 1.25 MG (50000 UNIT) CAPS  capsule Take 1 capsule (50,000 Units total) by mouth every 7 (seven) days. Take for 12 weeks then go to otc vit d 2,000 iu daily 02/15/21   Salley Scarlet, MD  ALPRAZolam Prudy Feeler) 1 MG tablet Take 1 tablet (1 mg total) by mouth 2 (two) times daily as needed for anxiety. Patient taking differently: Take 1 mg by mouth 2 (two) times daily as needed for anxiety. 0.5mg  at night 02/09/21   [place], Velna Hatchet, MD  buPROPion (WELLBUTRIN XL) 150 MG 24 hr tablet Take 1 tablet (150 mg total) by mouth daily. 06/14/21   Donita Brooks, MD  levonorgestrel (MIRENA) 20 MCG/24HR IUD 1 each by Intrauterine route once.    [provider]    Family History Family History  Problem Relation Age of Onset   Hypertension Mother    Hyperlipidemia Mother    Obesity Mother    Hypertension Father    Diabetes Father    Obesity Father     Social History Social History   Tobacco Use   Smoking status: Never   Smokeless tobacco: Never  Vaping Use   Vaping Use: Never used  Substance Use Topics  Alcohol use: Yes    Comment: wine on occ   Drug use: No     Allergies   Patient has no known allergies.   Review of Systems Review of Systems Pertinent negatives listed in HPI  Physical Exam Triage Vital Signs ED Triage Vitals [07/19/21 0925]  Enc Vitals Group     BP (!) 129/95     Pulse Rate 72     Resp 18     Temp 98.7 F (37.1 C)     Temp Source Oral     SpO2 98 %     Weight      Height      Head Circumference      Peak Flow      Pain Score 7     Pain Loc      Pain Edu?      Excl. in GC?    No data found.  Updated Vital Signs BP (!) 129/95   Pulse 72   Temp 98.7 F (37.1 C) (Oral)   Resp 18   SpO2 98%   Visual Acuity Right Eye Distance:   Left Eye Distance:   Bilateral Distance:    Right Eye Near:   Left Eye Near:    Bilateral Near:     Physical Exam   UC Treatments / Results  Labs (all labs ordered are listed, but only abnormal results are displayed) Labs  Reviewed  COVID-19, FLU A+B NAA    EKG   Radiology No results found.  Procedures Procedures (including critical care time)  Medications Ordered in UC Medications - No data to display  Initial Impression / Assessment and Plan / UC Course  I have reviewed the triage vital signs and the nursing notes.  Pertinent labs & imaging results that were available during my care of the patient were reviewed by me and considered in my medical decision making (see chart for details).    COVID/Flu test pending. Symptom management warranted only.  Manage fever with Tylenol and ibuprofen.  Nasal symptoms with over-the-counter antihistamines recommended.  Treatment per discharge medications/discharge instructions.  Red flags/ER precautions given. The most current CDC isolation/quarantine recommendation advised.   Final Clinical Impressions(s) / UC Diagnoses   Final diagnoses:  Close exposure to COVID-19 virus  Viral illness     Discharge Instructions      Your COVID 19 results should result within 2-4 days. Negative results are immediately resulted to Mychart. Positive results will receive a follow-up call from our clinic. If symptoms are present, I recommend home quarantine until results are known.  Alternate Tylenol and ibuprofen as needed for body aches and fever.  Symptom management per recommendations discussed today.  If any breathing difficulty or chest pain develops go immediately to the closest emergency department for evaluation.      ED Prescriptions     Medication Sig Dispense Auth. Provider   predniSONE (DELTASONE) 20 MG tablet Take 1 tablet (20 mg total) by mouth daily with breakfast for 5 days. 5 tablet Bing Neighbors, FNP   promethazine-dextromethorphan (PROMETHAZINE-DM) 6.25-15 MG/5ML syrup Take 5 mLs by mouth 3 (three) times daily as needed for cough. 130 mL Bing Neighbors, FNP      PDMP not reviewed this encounter.   Bing Neighbors, FNP 07/19/21  1025

## 2021-07-19 NOTE — ED Triage Notes (Signed)
Pt here with dry cough, sore throat, and body aches since Friday.

## 2021-07-19 NOTE — Discharge Instructions (Addendum)
Your COVID 19 results should result within 2-4 days. Negative results are immediately resulted to Mychart. Positive results will receive a follow-up call from our clinic. If symptoms are present, I recommend home quarantine until results are known.  Alternate Tylenol and ibuprofen as needed for body aches and fever.  Symptom management per recommendations discussed today.  If any breathing difficulty or chest pain develops go immediately to the closest emergency department for evaluation.  

## 2021-07-20 LAB — COVID-19, FLU A+B NAA
Influenza A, NAA: NOT DETECTED
Influenza B, NAA: NOT DETECTED
SARS-CoV-2, NAA: NOT DETECTED

## 2021-07-23 DIAGNOSIS — F4323 Adjustment disorder with mixed anxiety and depressed mood: Secondary | ICD-10-CM | POA: Diagnosis not present

## 2021-07-30 DIAGNOSIS — F4323 Adjustment disorder with mixed anxiety and depressed mood: Secondary | ICD-10-CM | POA: Diagnosis not present

## 2021-08-09 DIAGNOSIS — F4323 Adjustment disorder with mixed anxiety and depressed mood: Secondary | ICD-10-CM | POA: Diagnosis not present

## 2021-08-20 DIAGNOSIS — F4323 Adjustment disorder with mixed anxiety and depressed mood: Secondary | ICD-10-CM | POA: Diagnosis not present

## 2021-08-27 DIAGNOSIS — F4323 Adjustment disorder with mixed anxiety and depressed mood: Secondary | ICD-10-CM | POA: Diagnosis not present

## 2021-09-03 DIAGNOSIS — F4323 Adjustment disorder with mixed anxiety and depressed mood: Secondary | ICD-10-CM | POA: Diagnosis not present

## 2021-09-10 DIAGNOSIS — F4323 Adjustment disorder with mixed anxiety and depressed mood: Secondary | ICD-10-CM | POA: Diagnosis not present

## 2021-09-24 DIAGNOSIS — F4323 Adjustment disorder with mixed anxiety and depressed mood: Secondary | ICD-10-CM | POA: Diagnosis not present

## 2021-10-01 DIAGNOSIS — F4323 Adjustment disorder with mixed anxiety and depressed mood: Secondary | ICD-10-CM | POA: Diagnosis not present

## 2021-10-08 DIAGNOSIS — L858 Other specified epidermal thickening: Secondary | ICD-10-CM | POA: Diagnosis not present

## 2021-10-08 DIAGNOSIS — Q825 Congenital non-neoplastic nevus: Secondary | ICD-10-CM | POA: Diagnosis not present

## 2021-10-11 DIAGNOSIS — F4323 Adjustment disorder with mixed anxiety and depressed mood: Secondary | ICD-10-CM | POA: Diagnosis not present

## 2021-10-29 DIAGNOSIS — F4323 Adjustment disorder with mixed anxiety and depressed mood: Secondary | ICD-10-CM | POA: Diagnosis not present

## 2021-11-02 DIAGNOSIS — Z30432 Encounter for removal of intrauterine contraceptive device: Secondary | ICD-10-CM | POA: Diagnosis not present

## 2021-11-02 DIAGNOSIS — Z01419 Encounter for gynecological examination (general) (routine) without abnormal findings: Secondary | ICD-10-CM | POA: Diagnosis not present

## 2021-11-12 DIAGNOSIS — F4323 Adjustment disorder with mixed anxiety and depressed mood: Secondary | ICD-10-CM | POA: Diagnosis not present

## 2021-11-26 DIAGNOSIS — F4323 Adjustment disorder with mixed anxiety and depressed mood: Secondary | ICD-10-CM | POA: Diagnosis not present

## 2021-12-03 DIAGNOSIS — F4323 Adjustment disorder with mixed anxiety and depressed mood: Secondary | ICD-10-CM | POA: Diagnosis not present

## 2021-12-13 DIAGNOSIS — F4323 Adjustment disorder with mixed anxiety and depressed mood: Secondary | ICD-10-CM | POA: Diagnosis not present

## 2022-01-07 DIAGNOSIS — F4323 Adjustment disorder with mixed anxiety and depressed mood: Secondary | ICD-10-CM | POA: Diagnosis not present

## 2022-01-14 DIAGNOSIS — F4323 Adjustment disorder with mixed anxiety and depressed mood: Secondary | ICD-10-CM | POA: Diagnosis not present

## 2022-02-11 DIAGNOSIS — Z23 Encounter for immunization: Secondary | ICD-10-CM | POA: Diagnosis not present

## 2022-02-11 DIAGNOSIS — F4323 Adjustment disorder with mixed anxiety and depressed mood: Secondary | ICD-10-CM | POA: Diagnosis not present

## 2022-02-25 DIAGNOSIS — F4323 Adjustment disorder with mixed anxiety and depressed mood: Secondary | ICD-10-CM | POA: Diagnosis not present

## 2022-03-11 DIAGNOSIS — F4323 Adjustment disorder with mixed anxiety and depressed mood: Secondary | ICD-10-CM | POA: Diagnosis not present

## 2022-04-08 DIAGNOSIS — F4323 Adjustment disorder with mixed anxiety and depressed mood: Secondary | ICD-10-CM | POA: Diagnosis not present

## 2022-04-15 DIAGNOSIS — F4323 Adjustment disorder with mixed anxiety and depressed mood: Secondary | ICD-10-CM | POA: Diagnosis not present

## 2022-04-29 DIAGNOSIS — F4323 Adjustment disorder with mixed anxiety and depressed mood: Secondary | ICD-10-CM | POA: Diagnosis not present

## 2022-06-13 ENCOUNTER — Ambulatory Visit
Admission: EM | Admit: 2022-06-13 | Discharge: 2022-06-13 | Disposition: A | Discharge status: Home / Self Care | Payer: BC Managed Care – PPO | Attending: Nurse Practitioner | Admitting: Nurse Practitioner

## 2022-06-13 ENCOUNTER — Telehealth: Payer: Self-pay

## 2022-06-13 DIAGNOSIS — Z113 Encounter for screening for infections with a predominantly sexual mode of transmission: Secondary | ICD-10-CM | POA: Diagnosis not present

## 2022-06-13 DIAGNOSIS — J029 Acute pharyngitis, unspecified: Secondary | ICD-10-CM | POA: Insufficient documentation

## 2022-06-13 LAB — POCT RAPID STREP A (OFFICE): Rapid Strep A Screen: NEGATIVE

## 2022-06-13 MED ORDER — IBUPROFEN 800 MG PO TABS: 800.0000 mg | ORAL_TABLET | Freq: Three times a day (TID) | ORAL | 0 refills | Status: DC

## 2022-06-13 MED ORDER — PSEUDOEPH-BROMPHEN-DM 30-2-10 MG/5ML PO SYRP: 5.0000 mL | ORAL_SOLUTION | Freq: Four times a day (QID) | ORAL | 0 refills | Status: DC | PRN

## 2022-06-13 MED ORDER — LIDOCAINE VISCOUS HCL 2 % MT SOLN: 5.0000 mL | Freq: Four times a day (QID) | OROMUCOSAL | 0 refills | Status: DC | PRN

## 2022-06-13 NOTE — ED Provider Notes (Signed)
[facility]    CSN: [id number] Arrival date & time: 06/13/22  [id number]      History   Chief Complaint Chief Complaint  Patient presents with   Sore Throat         HPI Claire Weber is a 36 y.o. female.   The history is provided by the patient.   Patient presents for complaints of sore throat that has been present for the past 4 days.  She states over the past 24 hours, it has become more difficult for her to swallow.  She also complains of headache, cough and nasal congestion.  She denies fever, ear pain, runny nose, wheezing, shortness of breath, or difficulty breathing.  She denies any GI symptoms, but states that it is more difficult to eat due to the throat pain.  She has been taking ibuprofen for her symptoms.    She is also concerned because she has recently established a new partner.  She is concerned that she may have an STI as result.  She complains of vaginal odor, along with her sore throat.  She denies vaginal itching, vaginal discharge, or urinary symptoms.  She reports 1 female partner in the past 90 days.  Her last menstrual cycle was on 06/06/2022.  Past Medical History:  Diagnosis Date   Anxiety    Depression    Medical history non-contributory    Vitamin D deficiency     Patient Active Problem List   Diagnosis Date Noted   GAD (generalized anxiety disorder) 12/25/2019   Vitamin D deficiency 12/23/2019   Hair loss 03/26/2019   Chronic insomnia 01/09/2019   Stress headaches 05/20/2016   Obesity 08/28/2015   MDD (major depressive disorder) 08/28/2015   Lumbago 08/28/2015   IUD (intrauterine device) in place 06/09/2015    Past Surgical History:  Procedure Laterality Date   ABDOMINAL HERNIA REPAIR     at age 63    OB History     Gravida  2   Para  1   Term  1   Preterm      AB  1   Living  1      SAB      IAB  1   Ectopic      Multiple  0   Live Births  1            Home Medications    Prior to Admission  medications   Medication Sig Start Date End Date Taking? Authorizing Provider  brompheniramine-pseudoephedrine-DM 30-2-10 MG/5ML syrup Take 5 mLs by mouth 4 (four) times daily as needed. 06/13/22  Yes Nykayla Marcelli-Warren, Alda Lea, NP  ibuprofen (ADVIL) 800 MG tablet Take 1 tablet (800 mg total) by mouth 3 (three) times daily. 06/13/22  Yes Asra Gambrel-Warren, Alda Lea, NP  lidocaine (XYLOCAINE) 2 % solution Use as directed 5 mLs in the mouth or throat every 6 (six) hours as needed for mouth pain. Gargle and spit 2mL every 6 hours as needed for throat pain. 06/13/22  Yes Ansar Skoda-Warren, Alda Lea, NP  Vitamin D, Ergocalciferol, (DRISDOL) 1.25 MG (50000 UNIT) CAPS capsule Take 1 capsule (50,000 Units total) by mouth every 7 (seven) days. Take for 12 weeks then go to otc vit d 2,000 iu daily 02/15/21   Alycia Rossetti, MD  ALPRAZolam Duanne Moron) 1 MG tablet Take 1 tablet (1 mg total) by mouth 2 (two) times daily as needed for anxiety. Patient taking differently: Take 1 mg by mouth 2 (two) times daily as  needed for anxiety. 0.5mg  at night 02/09/21   Salley Scarlet, MD  buPROPion (WELLBUTRIN XL) 150 MG 24 hr tablet Take 1 tablet (150 mg total) by mouth daily. 06/14/21   Donita Brooks, MD  levonorgestrel (MIRENA) 20 MCG/24HR IUD 1 each by Intrauterine route once.    [provider]  promethazine-dextromethorphan (PROMETHAZINE-DM) 6.25-15 MG/5ML syrup Take 5 mLs by mouth 3 (three) times daily as needed for cough. 07/19/21   Bing Neighbors, FNP    Family History Family History  Problem Relation Age of Onset   Hypertension Mother    Hyperlipidemia Mother    Obesity Mother    Hypertension Father    Diabetes Father    Obesity Father     Social History Social History   Tobacco Use   Smoking status: Never   Smokeless tobacco: Never  Vaping Use   Vaping Use: Never used  Substance Use Topics   Alcohol use: Yes    Comment: wine on occ   Drug use: No     Allergies   Patient has no known  allergies.   Review of Systems Review of Systems Per HPI  Physical Exam Triage Vital Signs ED Triage Vitals  Enc Vitals Group     BP 06/13/22 0911 131/86     Pulse Rate 06/13/22 0911 74     Resp 06/13/22 0911 18     Temp 06/13/22 0911 98.7 F (37.1 C)     Temp Source 06/13/22 0911 Oral     SpO2 06/13/22 0911 97 %     Weight --      Height --      Head Circumference --      Peak Flow --      Pain Score 06/13/22 0910 8     Pain Loc --      Pain Edu? --      Excl. in GC? --    No data found.  Updated Vital Signs BP 131/86 (BP Location: Right Arm)   Pulse 74   Temp 98.7 F (37.1 C) (Oral)   Resp 18   LMP 06/06/2022 (Exact Date)   SpO2 97%   Visual Acuity Right Eye Distance:   Left Eye Distance:   Bilateral Distance:    Right Eye Near:   Left Eye Near:    Bilateral Near:     Physical Exam Vitals and nursing note reviewed.  Constitutional:      Appearance: She is well-developed.  HENT:     Head: Normocephalic and atraumatic.     Right Ear: Tympanic membrane and ear canal normal.     Left Ear: Tympanic membrane and ear canal normal.     Nose: Congestion present.     Mouth/Throat:     Mouth: Mucous membranes are moist. No oral lesions.     Pharynx: Pharyngeal swelling and posterior oropharyngeal erythema present. No uvula swelling.     Tonsils: No tonsillar exudate. 1+ on the right. 1+ on the left.  Eyes:     Conjunctiva/sclera: Conjunctivae normal.     Pupils: Pupils are equal, round, and reactive to light.  Neck:     Thyroid: No thyromegaly.     Trachea: No tracheal deviation.  Cardiovascular:     Rate and Rhythm: Normal rate and regular rhythm.     Heart sounds: Normal heart sounds.  Pulmonary:     Effort: Pulmonary effort is normal.     Breath sounds: Normal breath sounds.  Abdominal:  General: Bowel sounds are normal. There is no distension.     Palpations: Abdomen is soft.     Tenderness: There is no abdominal tenderness.  Genitourinary:     Comments: GU exam deferred, self swab performed  Musculoskeletal:     Cervical back: Normal range of motion and neck supple.  Skin:    General: Skin is warm and dry.  Neurological:     General: No focal deficit present.     Mental Status: She is alert and oriented to person, place, and time.  Psychiatric:        Mood and Affect: Mood normal.        Behavior: Behavior normal.        Thought Content: Thought content normal.        Judgment: Judgment normal.      UC Treatments / Results  Labs (all labs ordered are listed, but only abnormal results are displayed) Labs Reviewed  CULTURE, GROUP A STREP [facility])  POCT RAPID STREP A (OFFICE)  CERVICOVAGINAL ANCILLARY ONLY  CYTOLOGY, (ORAL, ANAL, URETHRAL) ANCILLARY ONLY    EKG   Radiology No results found.  Procedures Procedures (including critical care time)  Medications Ordered in UC Medications - No data to display  Initial Impression / Assessment and Plan / UC Course  I have reviewed the triage vital signs and the nursing notes.  Pertinent labs & imaging results that were available during my care of the patient were reviewed by me and considered in my medical decision making (see chart for details).  Patient presents for respiratory symptoms that been present for the past 4 days.  She also is requesting STI testing, with a throat swab at this time.  She reports 1 new partner within the past 90 days.  Her vital signs are stable, exam is reassuring, patient is in no acute distress.  Her rapid strep test is negative.  Throat culture is pending along with oral and vaginal cytology.  Traumatic treatment was provided to the patient for her upper respiratory symptoms.  Supportive care recommendations were provided to the patient.  Patient advised to follow-up if symptoms do not improve. Final Clinical Impressions(s) / UC Diagnoses   Final diagnoses:  Screening examination for sexually transmitted disease  Acute pharyngitis,  unspecified etiology     Discharge Instructions      Take medication as prescribed. Take Ibuprofen every 8 hours for 48 hours then take as needed. Take medication with food and water. Increase fluids and allow for plenty of rest. Warm salt water gargles 3-4 times daily to help with throat pain or discomfort. Recommend using a humidifier at bedtime during sleep to help with cough and nasal congestion. Sleep elevated on 2 pillows.  Your cytology results should be available within the next 48 to 72 hours.  If the results are positive, you will be contacted to discuss treatment. Increase condom use. Follow-up if your symptoms do not improve.      ED Prescriptions     Medication Sig Dispense Auth. Provider   brompheniramine-pseudoephedrine-DM 30-2-10 MG/5ML syrup Take 5 mLs by mouth 4 (four) times daily as needed. 140 mL Justine Cossin-Warren, Alda Lea, NP   lidocaine (XYLOCAINE) 2 % solution Use as directed 5 mLs in the mouth or throat every 6 (six) hours as needed for mouth pain. Gargle and spit 76mL every 6 hours as needed for throat pain. 75 mL [place] Kretz-Warren, Alda Lea, NP   ibuprofen (ADVIL) 800 MG tablet Take 1 tablet (800 mg  total) by mouth 3 (three) times daily. 21 tablet Kumiko Fishman-Warren, Sadie [place], NP      PDMP not reviewed this encounter.   Abran Cantor, NP 06/13/22 1010

## 2022-06-13 NOTE — Discharge Instructions (Addendum)
Take medication as prescribed. Take Ibuprofen every 8 hours for 48 hours then take as needed. Take medication with food and water. Increase fluids and allow for plenty of rest. Warm salt water gargles 3-4 times daily to help with throat pain or discomfort. Recommend using a humidifier at bedtime during sleep to help with cough and nasal congestion. Sleep elevated on 2 pillows.  Your cytology results should be available within the next 48 to 72 hours.  If the results are positive, you will be contacted to discuss treatment. Increase condom use. Follow-up if your symptoms do not improve.

## 2022-06-13 NOTE — ED Triage Notes (Signed)
Pt reporta sore throat and pain when swallows x 2 days. Alka Seltzer gives no relief.

## 2022-06-14 LAB — CERVICOVAGINAL ANCILLARY ONLY
Bacterial Vaginitis (gardnerella): NEGATIVE
Candida Glabrata: NEGATIVE
Candida Vaginitis: NEGATIVE
Chlamydia: NEGATIVE
Comment: NEGATIVE
Comment: NEGATIVE
Comment: NEGATIVE
Comment: NEGATIVE
Comment: NEGATIVE
Comment: NORMAL
Neisseria Gonorrhea: NEGATIVE
Trichomonas: NEGATIVE

## 2022-06-14 LAB — CYTOLOGY, (ORAL, ANAL, URETHRAL) ANCILLARY ONLY
Chlamydia: NEGATIVE
Comment: NEGATIVE
Comment: NORMAL
Neisseria Gonorrhea: NEGATIVE

## 2022-06-16 LAB — CULTURE, GROUP A STREP (THRC)

## 2022-06-24 DIAGNOSIS — F4323 Adjustment disorder with mixed anxiety and depressed mood: Secondary | ICD-10-CM | POA: Diagnosis not present

## 2022-07-15 DIAGNOSIS — F4323 Adjustment disorder with mixed anxiety and depressed mood: Secondary | ICD-10-CM | POA: Diagnosis not present

## 2022-07-28 ENCOUNTER — Telehealth: Payer: Self-pay | Admitting: Family Medicine

## 2022-07-28 NOTE — Telephone Encounter (Signed)
Pt called stating she is seeking a new PCP. Her aunt is Gae Bon, [place] [phone]. She is wanting to know if you would take her on as a new patient?   Her previous PCP left and went to weight management.

## 2022-07-28 NOTE — Telephone Encounter (Signed)
Please advise 

## 2022-08-01 NOTE — Telephone Encounter (Signed)
I talked with patient and she will call me back and let me know who she would like appointment with.

## 2022-08-03 ENCOUNTER — Encounter (INDEPENDENT_AMBULATORY_CARE_PROVIDER_SITE_OTHER): Payer: Self-pay

## 2022-08-05 ENCOUNTER — Encounter: Payer: Self-pay | Admitting: Nurse Practitioner

## 2022-08-05 ENCOUNTER — Ambulatory Visit (INDEPENDENT_AMBULATORY_CARE_PROVIDER_SITE_OTHER): Payer: BC Managed Care – PPO | Admitting: Nurse Practitioner

## 2022-08-05 VITALS — BP 129/89 | HR 64 | Ht 63.0 in | Wt 253.0 lb

## 2022-08-05 DIAGNOSIS — F339 Major depressive disorder, recurrent, unspecified: Secondary | ICD-10-CM

## 2022-08-05 DIAGNOSIS — F411 Generalized anxiety disorder: Secondary | ICD-10-CM | POA: Diagnosis not present

## 2022-08-05 DIAGNOSIS — Z139 Encounter for screening, unspecified: Secondary | ICD-10-CM

## 2022-08-05 DIAGNOSIS — F5104 Psychophysiologic insomnia: Secondary | ICD-10-CM

## 2022-08-05 DIAGNOSIS — J359 Chronic disease of tonsils and adenoids, unspecified: Secondary | ICD-10-CM

## 2022-08-05 DIAGNOSIS — R5382 Chronic fatigue, unspecified: Secondary | ICD-10-CM

## 2022-08-05 NOTE — Assessment & Plan Note (Addendum)
GAD-7 score 16 Currently not on medication Not willing to restart medication today Options for different medications discussed with patient Patient encouraged to cal the office if she is willing to start medication for her condition Continue counseling Denies SI, HI Check TSH

## 2022-08-05 NOTE — Assessment & Plan Note (Signed)
Chronic condition Currently takes Benadryl as needed Sleep study ordered to rule out sleep apnea

## 2022-08-05 NOTE — Assessment & Plan Note (Addendum)
White elongating lesion noted on right tonsil, no exudate noted Oral cavity pharynx moist and pink Patient referred to ENT today due to her symptoms

## 2022-08-05 NOTE — Assessment & Plan Note (Signed)
PHQ-9 score 8 Currently not on medication Not willing to restart medication today Options for different medications discussed with patient Patient encouraged to cal the office if she is willing to start medication for her condition Continue counseling Denies SI, HI  Check TSH

## 2022-08-05 NOTE — Assessment & Plan Note (Addendum)
Wt Readings from Last 3 Encounters:  08/05/22 253 lb (114.8 kg)  02/10/21 256 lb (116.1 kg)  06/24/20 255 lb (115.7 kg)  Chronic condition.  She plans to start going to the gym Need to increase intake of whole food consisting mainly vegetables and and protein less carbohydrate drinking at least 64 ounces of water daily engaging in regular moderate to vigorous exercises at least 150 minutes weekly, importance of portion control also discussed Benefits of healthy weight discussed

## 2022-08-05 NOTE — Progress Notes (Signed)
New Patient Office Visit  Subjective    Patient ID: Claire Weber, female    DOB: 06/17/1986  Age: 36 y.o. MRN: [id number]  CC:  Chief Complaint  Patient presents with   New Patient (Initial Visit)    Np    spots on tonsils     Since around 8/5   Fatigue    Since around 8/5    HPI Claire Weber with past medical history of anxiety, depression, vitamin D deficiency, insomnia presents to establish care for her chronic medical conditions previous PCP Dr. Doristine Bosworth Rinka  Anxiety and depression.  She was previously on Wellbutrin but stopped taking medication due to headaches.  She has been dealing with her child's dad for over a decade.  [facility] states that counseling helps some.  She denies SI, HI.  States the Xanax helps her anxiety and depression when she has weaned herself off Xanax  Chronic insomnia.  States that she gets 5 to 6 hours of sleep nightly, does not feel well rested when she wakes up, cannot tell if she snores or quits breathing in her sleep.  Benadryl helps her to sleep.  Has family history of sleep apnea   Patient complains of a white spot on her right tonsil, first noticed the white spot about a week ago .site is a little bit itchy sometimes painful she denies foul mouth odor, difficulty swallowing, fever, chills,   Chronic fatigue ongoing for months, she denies fever, chills, weight loss, shortness of breath, edema     Outpatient Encounter Medications as of 08/05/2022  Medication Sig   Vitamin D, Ergocalciferol, (DRISDOL) 1.25 MG (50000 UNIT) CAPS capsule Take 1 capsule (50,000 Units total) by mouth every 7 (seven) days. Take for 12 weeks then go to otc vit d 2,000 iu daily (Patient not taking: Reported on 08/05/2022)   ALPRAZolam (XANAX) 1 MG tablet Take 1 tablet (1 mg total) by mouth 2 (two) times daily as needed for anxiety. (Patient not taking: Reported on 08/05/2022)   buPROPion (WELLBUTRIN  XL) 150 MG 24 hr tablet Take 1 tablet (150 mg total) by mouth daily. (Patient not taking: Reported on 08/05/2022)   ibuprofen (ADVIL) 800 MG tablet Take 1 tablet (800 mg total) by mouth 3 (three) times daily. (Patient not taking: Reported on 08/05/2022)   lidocaine (XYLOCAINE) 2 % solution Use as directed 5 mLs in the mouth or throat every 6 (six) hours as needed for mouth pain. Gargle and spit 4m every 6 hours as needed for throat pain. (Patient not taking: Reported on 08/05/2022)   [DISCONTINUED] brompheniramine-pseudoephedrine-DM 30-2-10 MG/5ML syrup Take 5 mLs by mouth 4 (four) times daily as needed.   [DISCONTINUED] levonorgestrel (MIRENA) 20 MCG/24HR IUD 1 each by Intrauterine route once.   [DISCONTINUED] promethazine-dextromethorphan (PROMETHAZINE-DM) 6.25-15 MG/5ML syrup Take 5 mLs by mouth 3 (three) times daily as needed for cough.   No facility-administered encounter medications on file as of 08/05/2022.    Past Medical History:  Diagnosis Date   Anxiety    Depression    Medical history non-contributory    Vitamin D deficiency     Past Surgical History:  Procedure Laterality Date   ABDOMINAL HERNIA REPAIR     at age 36   Family History  Problem Relation Age of Onset   Hypertension Mother    Hyperlipidemia Mother    Obesity Mother    Hypertension Father  Diabetes Father    Obesity Father    Hypertension Maternal Aunt    Diabetes Maternal Aunt    Breast cancer Maternal Aunt    Prostate cancer Maternal Uncle    Colon cancer Neg Hx    Cervical cancer Neg Hx     Social History   Socioeconomic History   Marital status: Single    Spouse name: Not on file   Number of children: 1   Years of education: Not on file   Highest education level: Not on file  Occupational History   Occupation: Art therapist  Tobacco Use   Smoking status: Never   Smokeless tobacco: Never  Vaping Use   Vaping Use: Never used  Substance and Sexual Activity   Alcohol use: Yes     Comment: wine on occ   Drug use: No   Sexual activity: Yes    Birth control/protection: Condom  Other Topics Concern   Not on file  Social History Narrative   Not on file   Social Determinants of Health   Financial Resource Strain: Not on file  Food Insecurity: Not on file  Transportation Needs: Not on file  Physical Activity: Not on file  Stress: Not on file  Social Connections: Not on file  Intimate Partner Violence: Not on file    Review of Systems  Constitutional: Negative.   HENT:  Positive for sore throat.   Respiratory: Negative.    Cardiovascular: Negative.   Musculoskeletal: Negative.   Skin: Negative.   Neurological: Negative.   Psychiatric/Behavioral:  Positive for depression. Negative for hallucinations, memory loss, substance abuse and suicidal ideas. The patient has insomnia. The patient is not nervous/anxious.         Objective    BP 129/89 (BP Location: Right Arm, Patient Position: Sitting, Cuff Size: Large)   Pulse 64   Ht '5\' 3"'  (1.6 m)   Wt 253 lb (114.8 kg)   LMP 07/31/2022 (Approximate)   SpO2 96%   BMI 44.82 kg/m   Physical Exam Vitals and nursing note reviewed.  Constitutional:      General: She is not in acute distress.    Appearance: She is obese. She is not ill-appearing, toxic-appearing or diaphoretic.  HENT:     Head: Normocephalic and atraumatic.     Right Ear: Tympanic membrane, ear canal and external ear normal. There is no impacted cerumen.     Left Ear: Tympanic membrane, ear canal and external ear normal. There is no impacted cerumen.     Nose: Nose normal. No congestion or rhinorrhea.     Mouth/Throat:     Mouth: Mucous membranes are dry.     Dentition: Normal dentition. No dental tenderness, gingival swelling, dental caries or gum lesions.     Tongue: No lesions. Tongue does not deviate from midline.     Pharynx: Oropharynx is clear. No pharyngeal swelling, oropharyngeal exudate, posterior oropharyngeal erythema or uvula  swelling.     Tonsils: No tonsillar exudate.     Comments: White colored lesion noted on right tonsil  Eyes:     General: No scleral icterus.       Right eye: No discharge.        Left eye: No discharge.     Extraocular Movements: Extraocular movements intact.     Conjunctiva/sclera: Conjunctivae normal.     Pupils: Pupils are equal, round, and reactive to light.  Neck:     Vascular: No carotid bruit.  Cardiovascular:  Rate and Rhythm: Normal rate and regular rhythm.     Pulses: Normal pulses.     Heart sounds: Normal heart sounds. No murmur heard.    No friction rub. No gallop.  Pulmonary:     Effort: Pulmonary effort is normal. No respiratory distress.     Breath sounds: Normal breath sounds. No stridor. No wheezing, rhonchi or rales.  Chest:     Chest wall: No tenderness.  Musculoskeletal:     Cervical back: Normal range of motion and neck supple. No rigidity or tenderness.  Lymphadenopathy:     Cervical: No cervical adenopathy.  Skin:    Coloration: Skin is not jaundiced or pale.     Findings: No erythema.  Neurological:     Mental Status: She is alert and oriented to person, place, and time.     Cranial Nerves: No cranial nerve deficit.     Sensory: No sensory deficit.     Coordination: Coordination normal.     Gait: Gait normal.     Deep Tendon Reflexes: Reflexes normal.  Psychiatric:        Mood and Affect: Mood normal.        Behavior: Behavior normal.        Thought Content: Thought content normal.        Judgment: Judgment normal.         Assessment & Plan:   Problem List Items Addressed This Visit       Respiratory   Lesion of tonsil    White elongating lesion noted on right tonsil most consistent with tonsillar stone , no exudate noted Oral cavity pharynx moist and pink Patient referred to ENT today due to her symptoms.      Relevant Orders   Ambulatory referral to ENT     Other   Obesity    Wt Readings from Last 3 Encounters:  08/05/22  253 lb (114.8 kg)  02/10/21 256 lb (116.1 kg)  06/24/20 255 lb (115.7 kg)  Chronic condition.  She plans to start going to the gym Need to increase intake of whole food consisting mainly vegetables and and protein less carbohydrate drinking at least 64 ounces of water daily engaging in regular moderate to vigorous exercises at least 150 minutes weekly, importance of portion control also discussed Benefits of healthy weight discussed      Chronic insomnia    Chronic condition Currently takes Benadryl as needed Sleep study ordered to rule out sleep apnea      Relevant Orders   CBC with Differential   TSH   Vitamin D (25 hydroxy)   Home sleep test   GAD (generalized anxiety disorder) - Primary    GAD-7 score 16 Currently not on medication Not willing to restart medication today Options for different medications discussed with patient Patient encouraged to cal the office if she is willing to start medication for her condition Continue counseling Denies SI, HI Check TSH      Depression, recurrent (HCC)    PHQ-9 score 8 Currently not on medication Not willing to restart medication today Options for different medications discussed with patient Patient encouraged to cal the office if she is willing to start medication for her condition Continue counseling Denies SI, HI  Check TSH      Chronic fatigue    Check TSH, vitamin D, CBC Patient encouraged to engage in regular moderate to vigorous exercises at least 150 minutes weekly Lose weight      Other Visit Diagnoses  Screening due       Relevant Orders   Lipid Profile   CBC with Differential   CMP14+EGFR   TSH   HgB A1c   Vitamin D (25 hydroxy)       Return in about 4 weeks (around 09/02/2022) for CPE.   Renee Rival, FNP

## 2022-08-05 NOTE — Assessment & Plan Note (Signed)
Check TSH, vitamin D, CBC Patient encouraged to engage in regular moderate to vigorous exercises at least 150 minutes weekly Lose weight

## 2022-08-05 NOTE — Patient Instructions (Signed)
Please get your fasting labs done 3-5 days before your appointment   You have been referred to the ENT specialist for the spot on your tonsil    It is important that you exercise regularly at least 30 minutes 5 times a week.  Think about what you will eat, plan ahead. Choose " clean, green, fresh or frozen" over canned, processed or packaged foods which are more sugary, salty and fatty. 70 to 75% of food eaten should be vegetables and fruit. Three meals at set times with snacks allowed between meals, but they must be fruit or vegetables. Aim to eat over a 12 hour period , example 7 am to 7 pm, and STOP after  your last meal of the day. Drink water,generally about 64 ounces per day, no other drink is as healthy. Fruit juice is best enjoyed in a healthy way, by EATING the fruit.  Thanks for choosing [facility], we consider it a privelige to serve you.

## 2022-08-06 NOTE — Addendum Note (Signed)
Addended by: Donell Beers on: 08/06/2022 08:05 AM   Modules accepted: Orders

## 2022-08-08 DIAGNOSIS — F4323 Adjustment disorder with mixed anxiety and depressed mood: Secondary | ICD-10-CM | POA: Diagnosis not present

## 2022-09-02 DIAGNOSIS — E559 Vitamin D deficiency, unspecified: Secondary | ICD-10-CM | POA: Diagnosis not present

## 2022-09-02 DIAGNOSIS — Z0000 Encounter for general adult medical examination without abnormal findings: Secondary | ICD-10-CM | POA: Diagnosis not present

## 2022-09-02 DIAGNOSIS — J029 Acute pharyngitis, unspecified: Secondary | ICD-10-CM | POA: Diagnosis not present

## 2022-09-02 DIAGNOSIS — Z131 Encounter for screening for diabetes mellitus: Secondary | ICD-10-CM | POA: Diagnosis not present

## 2022-09-02 DIAGNOSIS — Z13220 Encounter for screening for lipoid disorders: Secondary | ICD-10-CM | POA: Diagnosis not present

## 2022-09-09 DIAGNOSIS — F4323 Adjustment disorder with mixed anxiety and depressed mood: Secondary | ICD-10-CM | POA: Diagnosis not present

## 2022-09-16 ENCOUNTER — Encounter: Payer: BC Managed Care – PPO | Admitting: Nurse Practitioner

## 2022-10-10 DIAGNOSIS — F4323 Adjustment disorder with mixed anxiety and depressed mood: Secondary | ICD-10-CM | POA: Diagnosis not present

## 2022-11-03 DIAGNOSIS — R87610 Atypical squamous cells of undetermined significance on cytologic smear of cervix (ASC-US): Secondary | ICD-10-CM | POA: Diagnosis not present

## 2022-11-03 DIAGNOSIS — Z01419 Encounter for gynecological examination (general) (routine) without abnormal findings: Secondary | ICD-10-CM | POA: Diagnosis not present

## 2022-11-03 DIAGNOSIS — I10 Essential (primary) hypertension: Secondary | ICD-10-CM | POA: Diagnosis not present

## 2022-11-03 DIAGNOSIS — F419 Anxiety disorder, unspecified: Secondary | ICD-10-CM | POA: Diagnosis not present

## 2022-12-30 DIAGNOSIS — F4323 Adjustment disorder with mixed anxiety and depressed mood: Secondary | ICD-10-CM | POA: Diagnosis not present

## 2023-01-13 DIAGNOSIS — F4323 Adjustment disorder with mixed anxiety and depressed mood: Secondary | ICD-10-CM | POA: Diagnosis not present

## 2023-01-27 DIAGNOSIS — F4323 Adjustment disorder with mixed anxiety and depressed mood: Secondary | ICD-10-CM | POA: Diagnosis not present

## 2023-02-06 DIAGNOSIS — Z114 Encounter for screening for human immunodeficiency virus [HIV]: Secondary | ICD-10-CM | POA: Diagnosis not present

## 2023-02-06 DIAGNOSIS — Z113 Encounter for screening for infections with a predominantly sexual mode of transmission: Secondary | ICD-10-CM | POA: Diagnosis not present

## 2023-02-06 DIAGNOSIS — N760 Acute vaginitis: Secondary | ICD-10-CM | POA: Diagnosis not present

## 2023-02-20 DIAGNOSIS — U071 COVID-19: Secondary | ICD-10-CM | POA: Diagnosis not present

## 2023-03-03 DIAGNOSIS — F4323 Adjustment disorder with mixed anxiety and depressed mood: Secondary | ICD-10-CM | POA: Diagnosis not present

## 2023-04-07 DIAGNOSIS — F4323 Adjustment disorder with mixed anxiety and depressed mood: Secondary | ICD-10-CM | POA: Diagnosis not present

## 2023-04-28 DIAGNOSIS — F4323 Adjustment disorder with mixed anxiety and depressed mood: Secondary | ICD-10-CM | POA: Diagnosis not present

## 2023-05-26 DIAGNOSIS — F4323 Adjustment disorder with mixed anxiety and depressed mood: Secondary | ICD-10-CM | POA: Diagnosis not present

## 2023-06-12 DIAGNOSIS — L219 Seborrheic dermatitis, unspecified: Secondary | ICD-10-CM | POA: Diagnosis not present

## 2023-06-12 DIAGNOSIS — F4323 Adjustment disorder with mixed anxiety and depressed mood: Secondary | ICD-10-CM | POA: Diagnosis not present

## 2023-06-28 DIAGNOSIS — F4323 Adjustment disorder with mixed anxiety and depressed mood: Secondary | ICD-10-CM | POA: Diagnosis not present

## 2023-08-14 DIAGNOSIS — F4323 Adjustment disorder with mixed anxiety and depressed mood: Secondary | ICD-10-CM | POA: Diagnosis not present

## 2023-09-01 DIAGNOSIS — F4323 Adjustment disorder with mixed anxiety and depressed mood: Secondary | ICD-10-CM | POA: Diagnosis not present

## 2023-09-21 DIAGNOSIS — F5101 Primary insomnia: Secondary | ICD-10-CM | POA: Diagnosis not present

## 2023-09-21 DIAGNOSIS — F331 Major depressive disorder, recurrent, moderate: Secondary | ICD-10-CM | POA: Diagnosis not present

## 2023-09-21 DIAGNOSIS — Z0000 Encounter for general adult medical examination without abnormal findings: Secondary | ICD-10-CM | POA: Diagnosis not present

## 2023-09-25 DIAGNOSIS — Z13220 Encounter for screening for lipoid disorders: Secondary | ICD-10-CM | POA: Diagnosis not present

## 2023-09-25 DIAGNOSIS — E559 Vitamin D deficiency, unspecified: Secondary | ICD-10-CM | POA: Diagnosis not present

## 2023-10-27 DIAGNOSIS — F4323 Adjustment disorder with mixed anxiety and depressed mood: Secondary | ICD-10-CM | POA: Diagnosis not present

## 2024-02-29 ENCOUNTER — Ambulatory Visit (INDEPENDENT_AMBULATORY_CARE_PROVIDER_SITE_OTHER): Payer: BC Managed Care – PPO | Admitting: Dermatology

## 2024-02-29 ENCOUNTER — Encounter: Payer: Self-pay | Admitting: Dermatology

## 2024-02-29 VITALS — BP 131/87

## 2024-02-29 DIAGNOSIS — L6681 Central centrifugal cicatricial alopecia: Secondary | ICD-10-CM

## 2024-02-29 DIAGNOSIS — B350 Tinea barbae and tinea capitis: Secondary | ICD-10-CM

## 2024-02-29 DIAGNOSIS — L219 Seborrheic dermatitis, unspecified: Secondary | ICD-10-CM

## 2024-02-29 MED ORDER — GRISEOFULVIN ULTRAMICROSIZE 250 MG PO TABS: 250.0000 mg | ORAL_TABLET | Freq: Every day | ORAL | 1 refills | Status: DC

## 2024-02-29 MED ORDER — CLOBETASOL PROPIONATE 0.05 % EX SOLN: 1.0000 | Freq: Every day | CUTANEOUS | 0 refills | Status: DC

## 2024-02-29 NOTE — Progress Notes (Signed)
   New Patient Visit   Subjective  Claire Weber is a 38 y.o. female who presents for the following: New Pt - Seb Derm - Hair Shedding  Patient states she has seb derm located at the scalp that she would like to have examined. Patient reports the areas have been there for 7 months. She reports the areas are not bothersome.Patient rates irritation (itching)5 out of 10 at times. She states that the areas have not spread. Patient reports she has previously been treated for these areas by derm years ago. Was Rx shampoo but it was not covered by insurance. Patient reports Hx of bx. Patient denied family history of skin cancer(s).   The following portions of the chart were reviewed this encounter and updated as appropriate: medications, allergies, medical history  Review of Systems:  No other skin or systemic complaints except as noted in HPI or Assessment and Plan.  Objective  Well appearing patient in no apparent distress; mood and affect are within normal limits.   A focused examination was performed of the following areas: scalp   Relevant exam findings are noted in the Assessment and Plan.              Assessment & Plan   Central Centrifugal Cictricial Alopecia - Assessment: Patient presents with excessive hair shedding and breakage since September. Physical examination reveals thinning on the vertex with possible clinical signs of early scarring, as well as thinning on bilateral temples. 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 - Plan:    Apply clobetasol to temples and top of scalp once daily    Follow-up in 3 months    Consider intralesional kenalog and minoxidil at follow-up if condition does not improve  2. Suspected Tinea Capitis - Assessment: Physical examination shows a significant 6-centimeter patch of hair breakage on the left parietal scalp, suspicious for tinea capitis. - Plan:    Apply clobetasol to areas of  breakage    Griseofulvin 250mg  orally once daily with food for 6 weeks  3. Seborrheic Dermatitis - Assessment: Patient reports mild itching and flaking of the scalp, particularly when due for a shampoo. These symptoms are consistent with seborrheic dermatitis. - Plan:    Wash with DHS Sync shampoo once weekly    Use regular shampoo for hydration and moisture    Apply clobetasol solution as needed for flare-ups    No follow-ups on file.    Documentation: I have reviewed the above documentation for accuracy and completeness, and I agree with the above.  I, Claire Weber, CMA, am acting as scribe for [employer], DO.   Claire Reusing, DO

## 2024-02-29 NOTE — Patient Instructions (Addendum)
 Hello Claire Weber,  Thank you for visiting [place] today. We appreciate your commitment to addressing your scalp and hair concerns. Here is a summary of the key instructions from today's consultation:  Medications:   Clobetasol Solution: Apply to the temples and top of the scalp once daily.   Griseofulvin: Take 250 mg tablets once daily with food for six weeks to address suspected tinea capitis.  Skincare Routine:   Hair Washing: Use DHS Zinc every 1-2 weeks, followed by your regular shampoo to add hydration and moisture.   Clobetasol Solution: Apply as needed for flare-ups.  Follow-Up Appointment:   We will see you again in three months to monitor progress and possibly adjust your treatment.  Please follow these instructions closely and do not hesitate to contact our office if you have any questions or concerns.  Best regards,  Dr. Langston Reusing, Dermatology          Important Information  Due to recent changes in healthcare laws, you may see results of your pathology and/or laboratory studies on MyChart before the doctors have had a chance to review them. We understand that in some cases there may be results that are confusing or concerning to you. Please understand that not all results are received at the same time and often the doctors may need to interpret multiple results in order to provide you with the best plan of care or course of treatment. Therefore, we ask that you please give [place] 2 business days to thoroughly review all your results before contacting the office for clarification. Should we see a critical lab result, you will be contacted sooner.   If You Need Anything After Your Visit  If you have any questions or concerns for your doctor, please call our main line at [phone] If no one answers, please leave a voicemail as directed and we will return your call as soon as possible. Messages left after 4 pm will be answered the following business day.   You may also send [place] a  message via MyChart. We typically respond to MyChart messages within 1-2 business days.  For prescription refills, please ask your pharmacy to contact our office. Our fax number is [phone].  If you have an urgent issue when the clinic is closed that cannot wait until the next business day, you can page your doctor at the number below.    Please note that while we do our best to be available for urgent issues outside of office hours, we are not available 24/7.   If you have an urgent issue and are unable to reach [place], you may choose to seek medical care at your doctor's office, retail clinic, urgent care center, or emergency room.  If you have a medical emergency, please immediately call 911 or go to the emergency department. In the event of inclement weather, please call our main line at [phone] for an update on the status of any delays or closures.  Dermatology Medication Tips: Please keep the boxes that topical medications come in in order to help keep track of the instructions about where and how to use these. Pharmacies typically print the medication instructions only on the boxes and not directly on the medication tubes.   If your medication is too expensive, please contact our office at [phone] or send [place] a message through MyChart.   We are unable to tell what your co-pay for medications will be in advance as this is different depending on your insurance coverage. However, we  may be able to find a substitute medication at lower cost or fill out paperwork to get insurance to cover a needed medication.   If a prior authorization is required to get your medication covered by your insurance company, please allow [place] 1-2 business days to complete this process.  Drug prices often vary depending on where the prescription is filled and some pharmacies may offer cheaper prices.  The website www.goodrx.com contains coupons for medications through different pharmacies. The prices here  do not account for what the cost may be with help from insurance (it may be cheaper with your insurance), but the website can give you the price if you did not use any insurance.  - You can print the associated coupon and take it with your prescription to the pharmacy.  - You may also stop by our office during regular business hours and pick up a GoodRx coupon card.  - If you need your prescription sent electronically to a different pharmacy, notify our office through [facility] or by phone at [phone]

## 2024-03-08 DIAGNOSIS — Z3201 Encounter for pregnancy test, result positive: Secondary | ICD-10-CM | POA: Diagnosis not present

## 2024-05-09 DIAGNOSIS — R030 Elevated blood-pressure reading, without diagnosis of hypertension: Secondary | ICD-10-CM | POA: Diagnosis not present

## 2024-05-09 DIAGNOSIS — R2232 Localized swelling, mass and lump, left upper limb: Secondary | ICD-10-CM | POA: Diagnosis not present

## 2024-05-09 DIAGNOSIS — Z6841 Body Mass Index (BMI) 40.0 and over, adult: Secondary | ICD-10-CM | POA: Diagnosis not present

## 2024-05-17 DIAGNOSIS — E559 Vitamin D deficiency, unspecified: Secondary | ICD-10-CM | POA: Diagnosis not present

## 2024-05-17 DIAGNOSIS — F411 Generalized anxiety disorder: Secondary | ICD-10-CM | POA: Diagnosis not present

## 2024-05-17 DIAGNOSIS — F331 Major depressive disorder, recurrent, moderate: Secondary | ICD-10-CM | POA: Diagnosis not present

## 2024-05-17 DIAGNOSIS — Z6841 Body Mass Index (BMI) 40.0 and over, adult: Secondary | ICD-10-CM | POA: Diagnosis not present

## 2024-05-17 DIAGNOSIS — R030 Elevated blood-pressure reading, without diagnosis of hypertension: Secondary | ICD-10-CM | POA: Diagnosis not present

## 2024-05-23 DIAGNOSIS — N879 Dysplasia of cervix uteri, unspecified: Secondary | ICD-10-CM | POA: Diagnosis not present

## 2024-05-23 DIAGNOSIS — R87612 Low grade squamous intraepithelial lesion on cytologic smear of cervix (LGSIL): Secondary | ICD-10-CM | POA: Diagnosis not present

## 2024-05-23 DIAGNOSIS — Z3202 Encounter for pregnancy test, result negative: Secondary | ICD-10-CM | POA: Diagnosis not present

## 2024-05-23 DIAGNOSIS — F439 Reaction to severe stress, unspecified: Secondary | ICD-10-CM | POA: Diagnosis not present

## 2024-06-12 ENCOUNTER — Encounter: Payer: Self-pay | Admitting: Orthopaedic Surgery

## 2024-06-12 ENCOUNTER — Other Ambulatory Visit (INDEPENDENT_AMBULATORY_CARE_PROVIDER_SITE_OTHER)

## 2024-06-12 ENCOUNTER — Ambulatory Visit: Admitting: Orthopaedic Surgery

## 2024-06-12 VITALS — BP 124/86 | HR 64 | Ht 63.0 in | Wt 263.0 lb

## 2024-06-12 DIAGNOSIS — M79645 Pain in left finger(s): Secondary | ICD-10-CM

## 2024-06-12 DIAGNOSIS — R2232 Localized swelling, mass and lump, left upper limb: Secondary | ICD-10-CM

## 2024-06-12 NOTE — Progress Notes (Signed)
 Subjective:    Patient ID: Claire Weber, female    DOB: February 23, 1986, 38 y.o.   MRN: [id number]  HPI She has a cyst on the left ring finger in the middle phalanx area on the palm side.  It has been present about six weeks.  She denies a stick or insect bite.  It has not gone away.  It does not hurt but she is concerned it is present.  She has no redness, no discharge, no numbness.  She did try to pop it.   Review of Systems  Musculoskeletal:  Positive for arthralgias.  All other systems reviewed and are negative. For Review of Systems, all other systems reviewed and are negative.  The following is a summary of the past history medically, past history surgically, known current medicines, social history and family history.  This information is gathered electronically by the computer from prior information and documentation.  I review this each visit and have found including this information at this point in the chart is beneficial and informative.   Past Medical History:  Diagnosis Date   Anxiety    Depression    Medical history non-contributory    Vitamin D  deficiency     Past Surgical History:  Procedure Laterality Date   ABDOMINAL HERNIA REPAIR     at age 25    Current Outpatient Medications on File Prior to Visit  Medication Sig Dispense Refill   ALPRAZolam  (XANAX ) 1 MG tablet Take 1 tablet (1 mg total) by mouth 2 (two) times daily as needed for anxiety. 60 tablet 2   buPROPion  (WELLBUTRIN  XL) 150 MG 24 hr tablet Take 1 tablet (150 mg total) by mouth daily. 90 tablet 0   clobetasol  (TEMOVATE ) 0.05 % external solution Apply 1 Application topically daily. 50 mL 0   griseofulvin  (GRIS-PEG ) 250 MG tablet Take 1 tablet (250 mg total) by mouth daily. 90 tablet 1   ibuprofen  (ADVIL ) 800 MG tablet Take 1 tablet (800 mg total) by mouth 3 (three) times daily. 21 tablet 0   lidocaine  (XYLOCAINE ) 2 % solution Use as directed 5 mLs in the mouth or throat every 6 (six) hours as needed for  mouth pain. Gargle and spit 5mL every 6 hours as needed for throat pain. 75 mL 0   Vitamin D , Ergocalciferol , (DRISDOL ) 1.25 MG (50000 UNIT) CAPS capsule Take 1 capsule (50,000 Units total) by mouth every 7 (seven) days. Take for 12 weeks then go to otc vit d 2,000 iu daily 12 capsule 0   No current facility-administered medications on file prior to visit.    Social History   Socioeconomic History   Marital status: Single    Spouse name: Not on file   Number of children: 1   Years of education: Not on file   Highest education level: Not on file  Occupational History   Occupation: Sales executive  Tobacco Use   Smoking status: Never   Smokeless tobacco: Never  Vaping Use   Vaping status: Never Used  Substance and Sexual Activity   Alcohol use: Yes    Comment: wine on occ   Drug use: No   Sexual activity: Yes    Birth control/protection: Condom  Other Topics Concern   Not on file  Social History Narrative   Not on file   Social Drivers of Health   Financial Resource Strain: Not on file  Food Insecurity: Not on file  Transportation Needs: Not on file  Physical Activity: Not on  file  Stress: Not on file  Social Connections: Unknown (05/05/2022)   Received from [facility]   Social Network    Social Network: Not on file  Intimate Partner Violence: Unknown (03/30/2022)   Received from [facility]   HITS    Physically Hurt: Not on file    Insult or Talk Down To: Not on file    Threaten Physical Harm: Not on file    Scream or Curse: Not on file    Family History  Problem Relation Age of Onset   Hypertension Mother    Hyperlipidemia Mother    Obesity Mother    Hypertension Father    Diabetes Father    Obesity Father    Hypertension Maternal Aunt    Diabetes Maternal Aunt    Breast cancer Maternal Aunt    Prostate cancer Maternal Uncle    Colon cancer Neg Hx    Cervical cancer Neg Hx     BP 124/86   Pulse 64   Ht 5' 3 (1.6 m)   Wt 263 lb (119.3 kg)    BMI 46.59 kg/m   Body mass index is 46.59 kg/m.      Objective:   Physical Exam Vitals and nursing note reviewed. Exam conducted with a chaperone present.  Constitutional:      Appearance: She is well-developed.  HENT:     Head: Normocephalic and atraumatic.   Eyes:     Conjunctiva/sclera: Conjunctivae normal.     Pupils: Pupils are equal, round, and reactive to light.    Cardiovascular:     Rate and Rhythm: Normal rate and regular rhythm.  Pulmonary:     Effort: Pulmonary effort is normal.  Abdominal:     Palpations: Abdomen is soft.   Musculoskeletal:       Hands:     Cervical back: Normal range of motion and neck supple.   Skin:    General: Skin is warm and dry.   Neurological:     Mental Status: She is alert and oriented to person, place, and time.     Cranial Nerves: No cranial nerve deficit.     Motor: No abnormal muscle tone.     Coordination: Coordination normal.     Deep Tendon Reflexes: Reflexes are normal and symmetric. Reflexes normal.   Psychiatric:        Behavior: Behavior normal.        Thought Content: Thought content normal.        Judgment: Judgment normal.    X-rays were done of the left ring finger, reported separately.       Assessment & Plan:   Encounter Diagnosis  Name Primary?   Pain in left finger(s) Yes   I have told her that she could continue to watch the area.  She could have the cyst removed.  It is the size of a small pea.  She wants to think about it.  I will see prn.  She will call if she want surgery.  Dr. Phyllis Breeze or Dr. Ernesta Heading can see her for that if she desires.  Call if any problem.  Precautions discussed.  Electronically Signed Pleasant Brilliant, MD 6/18/202510:50 AM

## 2024-06-12 NOTE — Patient Instructions (Signed)
 Call if gets worse and we will set you up with one of our surgeons here at [facility].

## 2024-07-12 ENCOUNTER — Encounter: Payer: Self-pay | Admitting: Orthopedic Surgery

## 2024-07-12 ENCOUNTER — Ambulatory Visit: Admitting: Orthopedic Surgery

## 2024-07-12 DIAGNOSIS — M79645 Pain in left finger(s): Secondary | ICD-10-CM | POA: Diagnosis not present

## 2024-07-12 DIAGNOSIS — R2232 Localized swelling, mass and lump, left upper limb: Secondary | ICD-10-CM | POA: Diagnosis not present

## 2024-07-12 NOTE — Progress Notes (Signed)
   LMP 06/03/2024 (Exact Date)   There is no height or weight on file to calculate BMI.  Chief Complaint  Patient presents with   Hand Problem    Left ring finger     No diagnosis found.  DOI/DOS/ Date: ongoing/ has mass / here to discuss surgery   Unchanged

## 2024-07-12 NOTE — Progress Notes (Signed)
 ORTHOPAEDIC CONSULTATION  REQUESTING PHYSICIAN: Margrette Taft BRAVO, MD  ASSESSMENT AND PLAN: 38 y.o. female the patient has a mass on her left ring finger it appears to have vascular components it is unclear as to what it is it needs further imaging  Chief Complaint: Patient referred for mass left ring finger volar aspect over the middle phalanx  HPI: Claire Weber is a 38 y.o. female with  left ring finger pain.  Dr. Brenna saw the patient referred to me for possible excision of a mass over the volar aspect of her ring finger.  It does not appear to interfere with flexion as there is no numbness or tingling associated with it  Past Medical History:  Diagnosis Date   Anxiety    Depression    Medical history non-contributory    Vitamin D  deficiency    Past Surgical History:  Procedure Laterality Date   ABDOMINAL HERNIA REPAIR     at age 41   Social History   Socioeconomic History   Marital status: Single    Spouse name: Not on file   Number of children: 1   Years of education: Not on file   Highest education level: Not on file  Occupational History   Occupation: Sales executive  Tobacco Use   Smoking status: Never   Smokeless tobacco: Never  Vaping Use   Vaping status: Never Used  Substance and Sexual Activity   Alcohol use: Yes    Comment: wine on occ   Drug use: No   Sexual activity: Yes    Birth control/protection: Condom  Other Topics Concern   Not on file  Social History Narrative   Not on file   Social Drivers of Health   Financial Resource Strain: Not on file  Food Insecurity: Not on file  Transportation Needs: Not on file  Physical Activity: Not on file  Stress: Not on file  Social Connections: Unknown (05/05/2022)   Received from [employer]   Social Network    Social Network: Not on file   Family History  Problem Relation Age of Onset   Hypertension Mother    Hyperlipidemia Mother    Obesity Mother    Hypertension Father    Diabetes  Father    Obesity Father    Hypertension Maternal Aunt    Diabetes Maternal Aunt    Breast cancer Maternal Aunt    Prostate cancer Maternal Uncle    Colon cancer Neg Hx    Cervical cancer Neg Hx    No Known Allergies Prior to Admission medications   Medication Sig Start Date End Date Taking? Authorizing Provider  ALPRAZolam  (XANAX ) 1 MG tablet Take 1 tablet (1 mg total) by mouth 2 (two) times daily as needed for anxiety. 02/09/21   Bari Theodoro FALCON, MD  buPROPion  (WELLBUTRIN  XL) 150 MG 24 hr tablet Take 1 tablet (150 mg total) by mouth daily. 06/14/21   Duanne Butler DASEN, MD  clobetasol  (TEMOVATE ) 0.05 % external solution Apply 1 Application topically daily. 02/29/24   Alm Delon SAILOR, DO  griseofulvin  (GRIS-PEG ) 250 MG tablet Take 1 tablet (250 mg total) by mouth daily. 02/29/24   Alm Delon SAILOR, DO  ibuprofen  (ADVIL ) 800 MG tablet Take 1 tablet (800 mg total) by mouth 3 (three) times daily. 06/13/22   Leath-Warren, Etta PARAS, NP  lidocaine  (XYLOCAINE ) 2 % solution Use as directed 5 mLs in the mouth or throat every 6 (six) hours as needed for mouth pain. Gargle and spit  5mL every 6 hours as needed for throat pain. 06/13/22   Leath-Warren, Etta PARAS, NP  Vitamin D , Ergocalciferol , (DRISDOL ) 1.25 MG (50000 UNIT) CAPS capsule Take 1 capsule (50,000 Units total) by mouth every 7 (seven) days. Take for 12 weeks then go to otc vit d 2,000 iu daily 02/15/21   Bari Theodoro FALCON, MD   No results found. Family History Reviewed and non-contributory, no pertinent history of problems with bleeding or anesthesia    ROS     OBJECTIVE  Vitals:@IPVITALS (8:)@ General: Alert, no acute distress Cardiovascular: Warm extremities noted Respiratory: No cyanosis, no use of accessory musculature GI: No organomegaly, abdomen is soft and non-tender Skin: No lesions in the area of chief complaint other than those listed below in MSK exam.  Neurologic: Sensation intact distally save for the below mentioned  MSK exam Psychiatric: Patient is competent for consent with normal mood and affect Lymphatic: No swelling obvious and reported other than the area involved in the exam below   Extremities  Normal findings of the extremities include no lacerations of the skin no erythema, no tenderness, functional range of motion, no joint subluxations, normal muscle tone with the following exceptions Volar aspect left ring finger vascular appearing mass with blue color running through it peers to be 1/2 cm x 1 cm.  Is tender to touch   Labs cbc No results for input(s): WBC, HGB, HCT, PLT in the last 72 hours.  Labs inflam No results for input(s): CRP in the last 72 hours.  Invalid input(s): ESR  Labs coag No results for input(s): INR, PTT in the last 72 hours.  Invalid input(s): PT  No results for input(s): NA, K, CL, CO2, GLUCOSE, BUN, CREATININE, CALCIUM in the last 72 hours.  I have reviewed the following images and these are my conclusions  Recommend further imaging to assess characteristics of the mass before excision

## 2024-07-12 NOTE — Patient Instructions (Signed)
 While we are working on your approval for MRI please go ahead and call to schedule your appointment with [facility] within at least one (1) week.   Central Scheduling [phone]

## 2024-07-17 ENCOUNTER — Telehealth: Payer: Self-pay | Admitting: Orthopedic Surgery

## 2024-07-17 ENCOUNTER — Ambulatory Visit: Admitting: Orthopedic Surgery

## 2024-07-17 NOTE — Telephone Encounter (Signed)
 Dr. Areatha pt - spoke w/the pt, she would like to know if her MRI that is scheduled for this Saturday requires a prior auth and if so, did we get one.  [phone]

## 2024-07-18 NOTE — Telephone Encounter (Signed)
 I called the patient and advised, she verbalized understanding

## 2024-07-20 ENCOUNTER — Ambulatory Visit (HOSPITAL_COMMUNITY)
Admission: RE | Admit: 2024-07-20 | Discharge: 2024-07-20 | Disposition: A | Discharge status: Home / Self Care | Source: Ambulatory Visit | Attending: Orthopedic Surgery | Admitting: Orthopedic Surgery

## 2024-07-20 DIAGNOSIS — R2232 Localized swelling, mass and lump, left upper limb: Secondary | ICD-10-CM | POA: Insufficient documentation

## 2024-07-20 DIAGNOSIS — M79645 Pain in left finger(s): Secondary | ICD-10-CM | POA: Insufficient documentation

## 2024-07-20 MED ORDER — GADOBUTROL 1 MMOL/ML IV SOLN
10.0000 mL | Freq: Once | INTRAVENOUS | Status: AC | PRN
  Administered 2024-07-20: 10 mL via INTRAVENOUS

## 2024-07-29 ENCOUNTER — Encounter: Payer: Self-pay | Admitting: Orthopedic Surgery

## 2024-07-29 ENCOUNTER — Ambulatory Visit: Admitting: Orthopedic Surgery

## 2024-07-29 DIAGNOSIS — M79645 Pain in left finger(s): Secondary | ICD-10-CM

## 2024-07-29 DIAGNOSIS — D4819 Other specified neoplasm of uncertain behavior of connective and other soft tissue: Secondary | ICD-10-CM

## 2024-07-29 DIAGNOSIS — R2232 Localized swelling, mass and lump, left upper limb: Secondary | ICD-10-CM | POA: Diagnosis not present

## 2024-07-29 NOTE — Progress Notes (Signed)
 Follow-up  MRI results mass left hand ring finger  I reviewed the MRI there is a mass on the volar aspect of the left ring finger consistent with giant cell tumor of tendon sheath  Results reviewed and explained to the patient  Patient will be referred to hand surgeon for definitive management  She would like this excised

## 2024-07-29 NOTE — Progress Notes (Signed)
   There were no vitals taken for this visit.  There is no height or weight on file to calculate BMI.  Chief Complaint  Patient presents with   Hand Problem    Left hand/ finger mass here to review the MRI scan     No diagnosis found.  DOI/DOS/ Date: ongoing   Unchanged

## 2024-07-29 NOTE — Patient Instructions (Signed)
We are referring you to [facility] from [facility] address is [address] [place] The phone number is [phone]  The office will call you with an appointment Dr. Fara Boros

## 2024-08-02 ENCOUNTER — Ambulatory Visit: Admitting: Orthopedic Surgery

## 2024-08-06 ENCOUNTER — Encounter: Payer: Self-pay | Admitting: Dermatology

## 2024-08-06 ENCOUNTER — Ambulatory Visit (INDEPENDENT_AMBULATORY_CARE_PROVIDER_SITE_OTHER): Admitting: Dermatology

## 2024-08-06 VITALS — BP 152/81 | HR 100

## 2024-08-06 DIAGNOSIS — L219 Seborrheic dermatitis, unspecified: Secondary | ICD-10-CM

## 2024-08-06 DIAGNOSIS — L6681 Central centrifugal cicatricial alopecia: Secondary | ICD-10-CM | POA: Diagnosis not present

## 2024-08-06 MED ORDER — CLOBETASOL PROPIONATE 0.05 % EX SOLN: 1.0000 | Freq: Every day | CUTANEOUS | 5 refills | Status: DC

## 2024-08-06 NOTE — Patient Instructions (Addendum)
 Date: Tue Aug 06 2024  Hello Claire Weber,  Thank you for visiting today. Here is a summary of the key instructions:  - Medications:   - Continue using clobetasol  solution on affected scalp areas every other day for the next 3 months   - After 3 months, use clobetasol  2-3 times a week on the vertex area only  - Hair Care:   - Use DHS zinc shampoo every 1-2 weeks:     - Wash hair with DHS zinc shampoo     - Let it sit for 3 minutes     - Rinse out     - Follow with a regular hydrating shampoo   - Do not go more than 2 weeks between hair washes  - Follow-up:   - Return for a follow-up appointment in 6 months  Please reach out if you have any questions or concerns.  Warm regards,  Dr. Delon Lenis [facility]           Important Information  Due to recent changes in healthcare laws, you may see results of your pathology and/or laboratory studies on MyChart before the doctors have had a chance to review them. We understand that in some cases there may be results that are confusing or concerning to you. Please understand that not all results are received at the same time and often the doctors may need to interpret multiple results in order to provide you with the best plan of care or course of treatment. Therefore, we ask that you please give us  2 business days to thoroughly review all your results before contacting the office for clarification. Should we see a critical lab result, you will be contacted sooner.   If You Need Anything After Your Visit  If you have any questions or concerns for your doctor, please call our main line at [phone] If no one answers, please leave a voicemail as directed and we will return your call as soon as possible. Messages left after 4 pm will be answered the following business day.   You may also send us  a message via MyChart. We typically respond to MyChart messages within 1-2 business days.  For prescription refills, please ask your  pharmacy to contact our office. Our fax number is [phone].  If you have an urgent issue when the clinic is closed that cannot wait until the next business day, you can page your doctor at the number below.    Please note that while we do our best to be available for urgent issues outside of office hours, we are not available 24/7.   If you have an urgent issue and are unable to reach us , you may choose to seek medical care at your doctor's office, retail clinic, urgent care center, or emergency room.  If you have a medical emergency, please immediately call 911 or go to the emergency department. In the event of inclement weather, please call our main line at [phone] for an update on the status of any delays or closures.  Dermatology Medication Tips: Please keep the boxes that topical medications come in in order to help keep track of the instructions about where and how to use these. Pharmacies typically print the medication instructions only on the boxes and not directly on the medication tubes.   If your medication is too expensive, please contact our office at [phone] or send us  a message through MyChart.   We are unable to tell what your co-pay for medications will be  in advance as this is different depending on your insurance coverage. However, we may be able to find a substitute medication at lower cost or fill out paperwork to get insurance to cover a needed medication.   If a prior authorization is required to get your medication covered by your insurance company, please allow us  1-2 business days to complete this process.  Drug prices often vary depending on where the prescription is filled and some pharmacies may offer cheaper prices.  The website www.goodrx.com contains coupons for medications through different pharmacies. The prices here do not account for what the cost may be with help from insurance (it may be cheaper with your insurance), but the website can give  you the price if you did not use any insurance.  - You can print the associated coupon and take it with your prescription to the pharmacy.  - You may also stop by our office during regular business hours and pick up a GoodRx coupon card.  - If you need your prescription sent electronically to a different pharmacy, notify our office through [facility] or by phone at [phone]

## 2024-08-06 NOTE — Progress Notes (Signed)
   Follow-Up Visit   Subjective  Claire Weber is a 38 y.o. 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  Patient states she is using Clobetasol  but hasn't tried Griseofulvin  or the DHS shampoo.  She states she has seen an improvement in shedding but not much hair growth.     The following portions of the chart were reviewed this encounter and updated as appropriate: medications, allergies, medical history  Review of Systems:  No other skin or systemic complaints except as noted in HPI or Assessment and Plan.  Objective  Well appearing patient in no apparent distress; mood and affect are within normal limits.   A focused examination was performed of the following areas: Scalp  Relevant exam findings are noted in the Assessment and Plan.    Assessment & Plan  1. 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 There is evidence of improvement in hair regrowth, particularly in previously bare areas. The top of the scalp shows signs of scarring, which is consistent with CCCA. The patient reports slow hair regrowth, which is expected given the nature of hair growth cycles. Significant flaking persists, indicative of ongoing seborrheic dermatitis. The use of clobetasol  has been effective in reducing inflammation and promoting hair regrowth, as evidenced by the presence of short, new hairs in previously affected areas. - Plan:    Continue clobetasol  application to affected areas, increasing frequency to every other day for the next 3 months    After 3 months, reduce clobetasol  application to 2-3 times per week on the vertex area only    Start using DHS zinc shampoo weekly to biweekly - apply, scrub in, and let sit for 3 minutes before  rinsing, then follow with a hydrating shampoo    Refill prescription for clobetasol  with multiple refills    Provide patient education on the chronic nature of CCCA and the importance of ongoing treatment    Instruct on proper use of medicated shampoo and its role in controlling inflammation    Advise not to extend time between hair washes beyond 2 weeks to prevent buildup  Follow-up in 6 months to assess response to treatment modifications.      Return in about 6 months (around 02/06/2025) for [facility] derm and CCCA.  IBerwyn Lesches, Surg Tech III, am acting as scribe for [employer], DO.   Documentation: I have reviewed the above documentation for accuracy and completeness, and I agree with the above.  Delon Lenis, DO

## 2024-08-16 ENCOUNTER — Encounter: Payer: Self-pay | Admitting: Radiology

## 2024-09-03 ENCOUNTER — Ambulatory Visit: Admitting: Dermatology

## 2024-09-09 ENCOUNTER — Ambulatory Visit: Admitting: Orthopedic Surgery

## 2024-09-19 ENCOUNTER — Ambulatory Visit: Admitting: Orthopedic Surgery

## 2024-09-23 ENCOUNTER — Ambulatory Visit
Admission: EM | Admit: 2024-09-23 | Discharge: 2024-09-23 | Disposition: A | Discharge status: Home / Self Care | Attending: Family Medicine | Admitting: Family Medicine

## 2024-09-23 DIAGNOSIS — S61011A Laceration without foreign body of right thumb without damage to nail, initial encounter: Secondary | ICD-10-CM | POA: Diagnosis not present

## 2024-09-23 MED ORDER — CHLORHEXIDINE GLUCONATE 4 % EX SOLN: Freq: Every day | CUTANEOUS | 0 refills | Status: DC | PRN

## 2024-09-23 MED ORDER — MUPIROCIN 2 % EX OINT: 1.0000 | TOPICAL_OINTMENT | Freq: Two times a day (BID) | CUTANEOUS | 0 refills | Status: DC

## 2024-09-23 NOTE — ED Provider Notes (Signed)
 [facility]    CSN: [id number] Arrival date & time: 09/23/24  1745      History   Chief Complaint No chief complaint on file.   HPI Claire Weber is a 38 y.o. female.   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  She states the instrument that cut her was not something that came in contact with patient bodily fluids.  She denies uncontrolled bleeding, decreased range of motion, concern for foreign body, numbness, tingling, weakness.  Has kept the area clean and covered all day.  Last tetanus shot was either in 2016 or 2020 per patient, she cannot fully recall.  She does not wish to have this updated today.    Past Medical History:  Diagnosis Date   Anxiety    Depression    Medical history non-contributory    Vitamin D  deficiency     Patient Active Problem List   Diagnosis Date Noted   Depression, recurrent 08/05/2022   Lesion of tonsil 08/05/2022   Chronic fatigue 08/05/2022   GAD (generalized anxiety disorder) 12/25/2019   Vitamin D  deficiency 12/23/2019   Hair loss 03/26/2019   Chronic insomnia 01/09/2019   Stress headaches 05/20/2016   Obesity 08/28/2015   MDD (major depressive disorder) 08/28/2015   Lumbago 08/28/2015   IUD (intrauterine device) in place 06/09/2015    Past Surgical History:  Procedure Laterality Date   ABDOMINAL HERNIA REPAIR     at age 22    OB History     Gravida  2   Para  1   Term  1   Preterm      AB  1   Living  1      SAB      IAB  1   Ectopic      Multiple  0   Live Births  1            Home Medications    Prior to Admission medications   Medication Sig Start Date End Date Taking? Authorizing Provider  chlorhexidine (HIBICLENS) 4 % external liquid Apply topically daily as needed. 09/23/24  Yes Stuart Vernell Norris, PA-C  mupirocin ointment (BACTROBAN) 2 % Apply 1 Application topically 2  (two) times daily. 09/23/24  Yes Stuart Vernell Norris, PA-C  ALPRAZolam  (XANAX ) 1 MG tablet Take 1 tablet (1 mg total) by mouth 2 (two) times daily as needed for anxiety. Patient not taking: Reported on 08/06/2024 02/09/21   Bari Theodoro FALCON, MD  buPROPion  (WELLBUTRIN  XL) 150 MG 24 hr tablet Take 1 tablet (150 mg total) by mouth daily. Patient not taking: Reported on 08/06/2024 06/14/21   Duanne Butler DASEN, MD  clobetasol  (TEMOVATE ) 0.05 % external solution Apply 1 Application topically daily. 08/06/24   Alm Delon SAILOR, DO  griseofulvin  (GRIS-PEG ) 250 MG tablet Take 1 tablet (250 mg total) by mouth daily. Patient not taking: Reported on 08/06/2024 02/29/24   Alm Delon SAILOR, DO  ibuprofen  (ADVIL ) 800 MG tablet Take 1 tablet (800 mg total) by mouth 3 (three) times daily. Patient not taking: Reported on 08/06/2024 06/13/22   Leath-Warren, Etta PARAS, NP  lidocaine  (XYLOCAINE ) 2 % solution Use as directed 5 mLs in the mouth or throat every 6 (six) hours as needed for mouth pain. Gargle and spit 5mL every 6 hours as needed for throat pain. Patient not taking: Reported on 08/06/2024 06/13/22   Leath-Warren,  Etta PARAS, NP  Vitamin D , Ergocalciferol , (DRISDOL ) 1.25 MG (50000 UNIT) CAPS capsule Take 1 capsule (50,000 Units total) by mouth every 7 (seven) days. Take for 12 weeks then go to otc vit d 2,000 iu daily 02/15/21   Bari Theodoro FALCON, MD    Family History Family History  Problem Relation Age of Onset   Hypertension Mother    Hyperlipidemia Mother    Obesity Mother    Hypertension Father    Diabetes Father    Obesity Father    Hypertension Maternal Aunt    Diabetes Maternal Aunt    Breast cancer Maternal Aunt    Prostate cancer Maternal Uncle    Colon cancer Neg Hx    Cervical cancer Neg Hx     Social History Social History   Tobacco Use   Smoking status: Never   Smokeless tobacco: Never  Vaping Use   Vaping status: Never Used  Substance Use Topics   Alcohol use: Yes    Comment:  wine on occ   Drug use: No     Allergies   Patient has no known allergies.   Review of Systems Review of Systems Per HPI  Physical Exam Triage Vital Signs ED Triage Vitals  Encounter Vitals Group     BP 09/23/24 1851 118/79     Girls Systolic BP Percentile --      Girls Diastolic BP Percentile --      Boys Systolic BP Percentile --      Boys Diastolic BP Percentile --      Pulse Rate 09/23/24 1851 83     Resp 09/23/24 1851 20     Temp 09/23/24 1851 98.2 F (36.8 C)     Temp Source 09/23/24 1851 Oral     SpO2 09/23/24 1851 97 %     Weight --      Height --      Head Circumference --      Peak Flow --      Pain Score 09/23/24 1855 0     Pain Loc --      Pain Education --      Exclude from Growth Chart --    No data found.  Updated Vital Signs BP 118/79 (BP Location: Right Arm)   Pulse 83   Temp 98.2 F (36.8 C) (Oral)   Resp 20   LMP 09/11/2024 (Exact Date)   SpO2 97%   Visual Acuity Right Eye Distance:   Left Eye Distance:   Bilateral Distance:    Right Eye Near:   Left Eye Near:    Bilateral Near:     Physical Exam Vitals and nursing note reviewed.  Constitutional:      Appearance: Normal appearance. She is not ill-appearing.  HENT:     Head: Atraumatic.  Eyes:     Extraocular Movements: Extraocular movements intact.     Conjunctiva/sclera: Conjunctivae normal.  Cardiovascular:     Rate and Rhythm: Normal rate.  Pulmonary:     Effort: Pulmonary effort is normal.  Musculoskeletal:        General: Normal range of motion.     Cervical back: Normal range of motion and neck supple.  Skin:    General: Skin is warm.     Comments: 0.25 cm superficial laceration to the dorsal aspect of the right thumb, well-approximated at rest and no active bleeding.  No foreign body appreciable in wound  Neurological:     Mental Status: She is alert and  oriented to person, place, and time.     Comments: Right upper extremity neurovascularly intact  Psychiatric:         Mood and Affect: Mood normal.        Thought Content: Thought content normal.        Judgment: Judgment normal.      UC Treatments / Results  Labs (all labs ordered are listed, but only abnormal results are displayed) Labs Reviewed - No data to display  EKG   Radiology No results found.  Procedures Laceration Repair  Date/Time: 09/23/2024 7:40 PM  Performed by: Stuart Vernell Norris, PA-C Authorized by: Stuart Vernell Norris, PA-C   Consent:    Consent obtained:  Verbal   Consent given by:  Patient   Risks, benefits, and alternatives were discussed: yes     Risks discussed:  Infection, pain, poor cosmetic result and poor wound healing   Alternatives discussed:  No treatment Universal protocol:    Procedure explained and questions answered to patient or proxy's satisfaction: yes     Relevant documents present and verified: yes     Patient identity confirmed:  Verbally with patient and arm band Anesthesia:    Anesthesia method:  None Laceration details:    Location:  Finger   Finger location:  R thumb   Length (cm):  0.3   Depth (mm):  1 Pre-procedure details:    Preparation:  Patient was prepped and draped in usual sterile fashion Exploration:    Limited defect created (wound extended): no     Hemostasis achieved with:  Direct pressure   Wound exploration: wound explored through full range of motion     Wound extent: no foreign bodies/material noted     Contaminated: no   Treatment:    Area cleansed with:  Chlorhexidine   Amount of cleaning:  Standard   Irrigation solution:  Sterile saline   Irrigation method:  Pressure wash   Visualized foreign bodies/material removed: no   Skin repair:    Repair method:  Tissue adhesive Approximation:    Approximation:  Close Repair type:    Repair type:  Simple Post-procedure details:    Dressing:  Open (no dressing)   Procedure completion:  Tolerated well, no immediate complications  (including critical care  time)  Medications Ordered in UC Medications - No data to display  Initial Impression / Assessment and Plan / UC Course  I have reviewed the triage vital signs and the nursing notes.  Pertinent labs & imaging results that were available during my care of the patient were reviewed by me and considered in my medical decision making (see chart for details).     Wound was thoroughly cleaned, explored today and closed with Dermabond.  Discussed that once Dermabond comes off, start cleaning with Hibiclens, mupirocin and nonstick dressings until fully healed.  Offered to update Tdap as it is likely been 5 years or more but she declines at this time.  Follow-up for worsening or unresolving symptoms.  Final Clinical Impressions(s) / UC Diagnoses   Final diagnoses:  Laceration of right thumb without foreign body without damage to nail, initial encounter     Discharge Instructions      The glue is waterproof and you may continue your normal activities while this is intact.  Once it peels itself off over the next few days, you may start cleaning once to twice daily with the Hibiclens solution and applying the mupirocin ointment prescribed as well as a nonstick dressing.  Keep it clean and covered at all times until fully healed.    ED Prescriptions     Medication Sig Dispense Auth. Provider   chlorhexidine (HIBICLENS) 4 % external liquid Apply topically daily as needed. 236 mL Stuart Vernell Norris, PA-C   mupirocin ointment (BACTROBAN) 2 % Apply 1 Application topically 2 (two) times daily. 60 g Stuart Vernell Norris, [place]      PDMP not reviewed this encounter.   Stuart Vernell Norris, [place] 09/23/24 1942

## 2024-09-23 NOTE — ED Triage Notes (Signed)
 Pt reports a laceration to her right thumb, pt states she was at work cleaning up at work and something sharp cut her thumb. Occurred toda.

## 2024-09-23 NOTE — Discharge Instructions (Signed)
 The glue is waterproof and you may continue your normal activities while this is intact.  Once it peels itself off over the next few days, you may start cleaning once to twice daily with the Hibiclens solution and applying the mupirocin ointment prescribed as well as a nonstick dressing.  Keep it clean and covered at all times until fully healed.

## 2024-09-30 ENCOUNTER — Other Ambulatory Visit: Payer: Self-pay

## 2024-09-30 ENCOUNTER — Encounter (HOSPITAL_COMMUNITY): Payer: Self-pay

## 2024-09-30 ENCOUNTER — Emergency Department (HOSPITAL_COMMUNITY)
Admission: EM | Admit: 2024-09-30 | Discharge: 2024-09-30 | Disposition: A | Discharge status: Home / Self Care | Attending: Emergency Medicine | Admitting: Emergency Medicine

## 2024-09-30 ENCOUNTER — Ambulatory Visit: Admitting: Orthopedic Surgery

## 2024-09-30 DIAGNOSIS — K1379 Other lesions of oral mucosa: Secondary | ICD-10-CM | POA: Insufficient documentation

## 2024-09-30 MED ORDER — METHYLPREDNISOLONE SODIUM SUCC 40 MG IJ SOLR
40.0000 mg | Freq: Once | INTRAMUSCULAR | Status: AC
  Administered 2024-09-30: 40 mg via INTRAMUSCULAR
  Filled 2024-09-30: qty 1

## 2024-09-30 MED ORDER — FAMOTIDINE 20 MG PO TABS
20.0000 mg | ORAL_TABLET | Freq: Once | ORAL | Status: AC
  Administered 2024-09-30: 20 mg via ORAL
  Filled 2024-09-30: qty 1

## 2024-09-30 NOTE — ED Provider Notes (Signed)
 [place] EMERGENCY DEPARTMENT AT [facility] Provider Note   CSN: [id number] Arrival date & time: 09/30/24  8167     Patient presents with: Allergic Reaction   Claire Weber is a 38 y.o. female.   37 year old female no known allergies who presents to the emergency department due to concerns for an allergic reaction.  Patient reports that she was eating some big red count around lunchtime.  Says an hour later she looked in the inside of her mouth and noticed it was red.  Says that she is having a tingling sensation on the inside of her mouth as well.  No hives.  No difficulty breathing.  No swelling in the back of her throat.  No nausea or vomiting.  Has had big red gum before without any reactions.  No other exposures to anything that she thinks she is having allergic reaction to.        Prior to Admission medications   Medication Sig Start Date End Date Taking? Authorizing Provider  ALPRAZolam  (XANAX ) 1 MG tablet Take 1 tablet (1 mg total) by mouth 2 (two) times daily as needed for anxiety. Patient not taking: Reported on 08/06/2024 02/09/21   Bari Theodoro FALCON, MD  buPROPion  (WELLBUTRIN  XL) 150 MG 24 hr tablet Take 1 tablet (150 mg total) by mouth daily. Patient not taking: Reported on 08/06/2024 06/14/21   Duanne Butler DASEN, MD  chlorhexidine (HIBICLENS) 4 % external liquid Apply topically daily as needed. 09/23/24   Stuart Vernell Norris, PA-C  clobetasol  (TEMOVATE ) 0.05 % external solution Apply 1 Application topically daily. 08/06/24   Alm Delon SAILOR, DO  griseofulvin  (GRIS-PEG ) 250 MG tablet Take 1 tablet (250 mg total) by mouth daily. Patient not taking: Reported on 08/06/2024 02/29/24   Alm Delon SAILOR, DO  ibuprofen  (ADVIL ) 800 MG tablet Take 1 tablet (800 mg total) by mouth 3 (three) times daily. Patient not taking: Reported on 08/06/2024 06/13/22   Leath-Warren, Etta PARAS, NP  lidocaine  (XYLOCAINE ) 2 % solution Use as directed 5 mLs in the mouth or throat every 6  (six) hours as needed for mouth pain. Gargle and spit 5mL every 6 hours as needed for throat pain. Patient not taking: Reported on 08/06/2024 06/13/22   Leath-Warren, Etta PARAS, NP  mupirocin ointment (BACTROBAN) 2 % Apply 1 Application topically 2 (two) times daily. 09/23/24   Stuart Vernell Norris, PA-C  Vitamin D , Ergocalciferol , (DRISDOL ) 1.25 MG (50000 UNIT) CAPS capsule Take 1 capsule (50,000 Units total) by mouth every 7 (seven) days. Take for 12 weeks then go to otc vit d 2,000 iu daily 02/15/21   Bari Theodoro FALCON, MD    Allergies: Patient has no known allergies.    Review of Systems  Updated Vital Signs BP (!) 102/33   Pulse 82   Ht 5' 3 (1.6 m)   Wt 119.3 kg   LMP 09/11/2024 (Exact Date)   SpO2 99%   BMI 46.59 kg/m   Physical Exam Constitutional:      Appearance: Normal appearance.  HENT:     Head: Normocephalic and atraumatic.     Right Ear: Tympanic membrane, ear canal and external ear normal.     Left Ear: Tympanic membrane, ear canal and external ear normal.     Nose: Nose normal.     Mouth/Throat:     Pharynx: No oropharyngeal exudate or posterior oropharyngeal erythema.     Comments: Redness on the inside of her cheeks. No tongue or lip swelling Cardiovascular:  Rate and Rhythm: Normal rate and regular rhythm.  Pulmonary:     Effort: Pulmonary effort is normal.     Breath sounds: Normal breath sounds. No stridor.  Skin:    Findings: No rash.  Neurological:     Mental Status: She is alert.     (all labs ordered are listed, but only abnormal results are displayed) Labs Reviewed - No data to display  EKG: None  Radiology: No results found.   Procedures   Medications Ordered in the ED  methylPREDNISolone sodium succinate (SOLU-MEDROL) 40 mg/mL injection 40 mg (40 mg Intramuscular Given 09/30/24 1854)  famotidine (PEPCID) tablet 20 mg (20 mg Oral Given 09/30/24 1853)                                    Medical Decision  Making Risk Prescription drug management.   CEDRIC Weber is a 38 year old female no relevant past medical history who presents to the emergency department due to concerns for an allergic reaction  Initial Ddx:  Allergic reaction, local irritation, chewing gum side effect  MDM/Course:  Presents emergency department with tingling in her mouth and discomfort after chewing big bread cinnamon chewing gum.  On exam does have some red discoloration on the inside of her mouth which I suspect is from the gum.  I do not notice any swelling.  There are no signs of an allergic reaction otherwise or angioedema at this point in time.  She was given a shot of Solu-Medrol and given oral Pepcid.  Upon re-evaluation reported that she was feeling better.  Counseled to stop using big red chewing gums and she may have had a local reaction to one of the ingredients in the gum.  This patient presents to the ED for concern of complaints listed in HPI, this involves an extensive number of treatment options, and is a complaint that carries with it a high risk of complications and morbidity. Disposition including potential need for admission considered.   Dispo: DC Home. Return precautions discussed including, but not limited to, those listed in the AVS. Allowed pt time to ask questions which were answered fully prior to dc.  Records reviewed Outpatient Clinic Notes I have reviewed the patients home medications and made adjustments as needed  Portions of this note were generated with Dragon dictation software. Dictation errors may occur despite best attempts at proofreading.     Final diagnoses:  Mouth discomfort    ED Discharge Orders     None          [name], MD 09/30/24 2216

## 2024-09-30 NOTE — ED Triage Notes (Signed)
 Pt arrived via POV c/o possible allergic reaction to chewing Big Red Gum. Pt reports her oral cavity began feeling tingly and her mouth felt swollen apprx 2 hrs after chewing the gum. EDP present in Triage. Pt denies SOB or difficulty breathing. Pt denies any other symptoms.

## 2024-09-30 NOTE — Discharge Instructions (Signed)
 You were seen for your reaction to the chewing gum in the emergency department.   At home, please take Benadryl  for any discomfort and help you sleep.  You may take Pepcid during the day as well.    Check your MyChart online for the results of any tests that had not resulted by the time you left the emergency department.   Follow-up with your primary doctor in 2-3 days regarding your visit.    Return immediately to the emergency department if you experience any of the following: Difficulty breathing, rash, or any other concerning symptoms.    Thank you for visiting our Emergency Department. It was a pleasure taking care of you today.

## 2024-10-04 DIAGNOSIS — F331 Major depressive disorder, recurrent, moderate: Secondary | ICD-10-CM | POA: Diagnosis not present

## 2024-10-04 DIAGNOSIS — F411 Generalized anxiety disorder: Secondary | ICD-10-CM | POA: Diagnosis not present

## 2024-10-04 DIAGNOSIS — F5101 Primary insomnia: Secondary | ICD-10-CM | POA: Diagnosis not present

## 2024-10-04 DIAGNOSIS — Z6841 Body Mass Index (BMI) 40.0 and over, adult: Secondary | ICD-10-CM | POA: Diagnosis not present

## 2024-10-04 DIAGNOSIS — E559 Vitamin D deficiency, unspecified: Secondary | ICD-10-CM | POA: Diagnosis not present

## 2024-10-04 DIAGNOSIS — G4719 Other hypersomnia: Secondary | ICD-10-CM | POA: Diagnosis not present

## 2024-10-04 DIAGNOSIS — Z13220 Encounter for screening for lipoid disorders: Secondary | ICD-10-CM | POA: Diagnosis not present

## 2024-10-04 DIAGNOSIS — Z0000 Encounter for general adult medical examination without abnormal findings: Secondary | ICD-10-CM | POA: Diagnosis not present

## 2024-10-08 ENCOUNTER — Ambulatory Visit: Admitting: Orthopedic Surgery

## 2024-10-08 DIAGNOSIS — D4819 Other specified neoplasm of uncertain behavior of connective and other soft tissue: Secondary | ICD-10-CM

## 2024-10-08 NOTE — Progress Notes (Signed)
 Claire Weber - 38 y.o. female MRN [id number]  Date of birth: 19-Aug-1986  Office Visit Note: Visit Date: 10/08/2024 PCP: Pcp, No Referred by: Claire Taft BRAVO, MD  Subjective: Chief Complaint  Patient presents with   Left Ring Finger - Pain, Edema   HPI: Claire Weber is a pleasant 38 y.o. female who presents today for specific hand surgical evaluation for left ring finger mass that has been present now for approximately 6 months.  She was seen initially by Dr. 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  She was sent to me for specific hand surgical evaluation and surgical consultation.  She states that she has ongoing discomfort in this region and is functionally limiting, she is interested in surgical excision.  Pertinent ROS were reviewed with the patient and found to be negative unless otherwise specified above in HPI.   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  Reviewed the underlying etiology and pathophysiology of this condition as well as the potential recurrence despite surgical excision.  Risks and benefits of the procedure were discussed, risks including but not limited to infection, bleeding, scarring, stiffness, nerve injury, tendon injury, vascular injury, recurrence of symptoms  and need for subsequent operation.  We also discussed the appropriate postoperative protocol and timeframe for return to activities and function.  Patient expressed understanding.  She would like to move forward with surgical scheduling, we will perform left ring finger mass excision under IV sedation at the next available date per patient preference.   Follow-up: No follow-ups on file.   Meds & Orders: No orders of the defined types were placed in this encounter.  No orders of the defined types were placed in this encounter.    Procedures: No procedures performed      Clinical History: No specialty comments available.  She reports that she has never smoked. She has never used smokeless tobacco. No results for input(s): HGBA1C, LABURIC in the last 8760 hours.  Objective:   Vital Signs: LMP 09/11/2024 (Exact Date)   Physical Exam  Gen: Well-appearing, in no acute distress; non-toxic CV: Regular Rate. Well-perfused. Warm.  Resp: Breathing unlabored on room air; no wheezing. Psych: Fluid speech in conversation; appropriate affect; normal thought process  Ortho Exam Left hand: Ring finger - Palpable mass over the volar aspect of the P2 region of the ring finger, measures approximate 1.5 cm x 1.5 cm, firm, minimally mobile - Range of motion is well-preserved throughout the hand and ring finger, composite fist without significant restriction - Notable discomfort over the mass region - Normal color capillary refill distally to the digit, sensation is intact throughout   Imaging: No results found. Prior x-ray and MRI of the left hand were reviewed in detail by myself today  Past Medical/Family/Surgical/Social  History: Medications & Allergies reviewed per EMR, new medications updated. Patient Active Problem List   Diagnosis Date Noted   Depression, recurrent 08/05/2022   Lesion of tonsil 08/05/2022   Chronic fatigue 08/05/2022   GAD (generalized anxiety disorder) 12/25/2019    Vitamin D  deficiency 12/23/2019   Hair loss 03/26/2019   Chronic insomnia 01/09/2019   Stress headaches 05/20/2016   Obesity 08/28/2015   MDD (major depressive disorder) 08/28/2015   Lumbago 08/28/2015   IUD (intrauterine device) in place 06/09/2015   Past Medical History:  Diagnosis Date   Anxiety    Depression    Medical history non-contributory    Vitamin D  deficiency    Family History  Problem Relation Age of Onset   Hypertension Mother    Hyperlipidemia Mother    Obesity Mother    Hypertension Father    Diabetes Father    Obesity Father    Hypertension Maternal Aunt    Diabetes Maternal Aunt    Breast cancer Maternal Aunt    Prostate cancer Maternal Uncle    Colon cancer Neg Hx    Cervical cancer Neg Hx    Past Surgical History:  Procedure Laterality Date   ABDOMINAL HERNIA REPAIR     at age 54   Social History   Occupational History   Occupation: Sales executive  Tobacco Use   Smoking status: Never   Smokeless tobacco: Never  Vaping Use   Vaping status: Never Used  Substance and Sexual Activity   Alcohol use: Yes    Comment: wine on occ   Drug use: No   Sexual activity: Yes    Birth control/protection: Condom    Pleasant Britz Estela) Arlinda, M.D. [facility], Hand Surgery

## 2024-10-28 ENCOUNTER — Encounter: Payer: Self-pay | Admitting: Radiology

## 2024-11-01 ENCOUNTER — Telehealth: Payer: Self-pay | Admitting: Orthopedic Surgery

## 2024-11-01 NOTE — Telephone Encounter (Signed)
 FYI: I spoke with the patient in regards to scheduling surgery. Patient is not ready to schedule at this time. She is leaning towards a date after the new year. She will call back when she is ready to schedule.

## 2024-11-20 DIAGNOSIS — G4730 Sleep apnea, unspecified: Secondary | ICD-10-CM | POA: Diagnosis not present

## 2024-11-20 DIAGNOSIS — F5101 Primary insomnia: Secondary | ICD-10-CM | POA: Diagnosis not present

## 2025-01-24 ENCOUNTER — Ambulatory Visit: Admitting: Podiatry

## 2025-01-24 ENCOUNTER — Other Ambulatory Visit: Payer: Self-pay | Admitting: Podiatry

## 2025-01-24 ENCOUNTER — Ambulatory Visit

## 2025-01-24 ENCOUNTER — Encounter: Payer: Self-pay | Admitting: Podiatry

## 2025-01-24 ENCOUNTER — Ambulatory Visit (INDEPENDENT_AMBULATORY_CARE_PROVIDER_SITE_OTHER)

## 2025-01-24 DIAGNOSIS — M2141 Flat foot [pes planus] (acquired), right foot: Secondary | ICD-10-CM

## 2025-01-24 DIAGNOSIS — M2142 Flat foot [pes planus] (acquired), left foot: Secondary | ICD-10-CM

## 2025-01-24 DIAGNOSIS — M216X1 Other acquired deformities of right foot: Secondary | ICD-10-CM | POA: Diagnosis not present

## 2025-01-24 DIAGNOSIS — M216X2 Other acquired deformities of left foot: Secondary | ICD-10-CM | POA: Diagnosis not present

## 2025-01-24 DIAGNOSIS — M722 Plantar fascial fibromatosis: Secondary | ICD-10-CM

## 2025-01-24 DIAGNOSIS — M779 Enthesopathy, unspecified: Secondary | ICD-10-CM | POA: Diagnosis not present

## 2025-01-24 MED ORDER — TRIAMCINOLONE ACETONIDE 10 MG/ML IJ SUSP
10.0000 mg | Freq: Once | INTRAMUSCULAR | Status: AC
  Administered 2025-01-24: 10 mg via INTRA_ARTICULAR

## 2025-01-24 MED ORDER — TRIAMCINOLONE ACETONIDE 10 MG/ML IJ SUSP: 10.0000 mg | Freq: Once | INTRAMUSCULAR | Status: AC

## 2025-01-27 NOTE — Progress Notes (Signed)
 Subjective:   Patient ID: Claire Weber, female   DOB: 39 y.o.   MRN: [id number]   HPI Patient has had chronic ankle pain right foot pain also noted in the mid arch left and states that she has to stand at work and it is increasingly difficult with flatfoot deformity noted right over left foot.  Patient does not currently smoke and tries to be active   Review of Systems  All other systems reviewed and are negative.       Objective:  Physical Exam  Neurovascular status intact muscle strength found to be adequate range of motion adequate patient is noted to have significant collapse medial longitudinal arch right with inflammation tendon inflammation of the posterior tibial tendon secondary to acquired flatfoot deformity with left mid arch pain of a moderate nature.  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  At this point I do think customized orthotics to try to lift the arch and offload pressure from the mid arch would be best for her and she wants these done and is casted for functional orthotic devices.  I went ahead today and I then did sterile prep and injected the posterior tib sheath 3 mg dexamethasone Kenalog  5 mg Xylocaine  and advised on supportive therapy.  Patient was given risk prior to doing this and understands that and will be seen back to reevaluate when orthotics are ready  X-rays indicate significant collapse medial longitudinal arch right over left

## 2025-02-06 ENCOUNTER — Ambulatory Visit: Admitting: Dermatology

## 2025-02-28 ENCOUNTER — Encounter

## 2025-03-14 ENCOUNTER — Encounter: Admitting: Family Medicine
